# Patient Record
Sex: Female | Born: 1960 | Race: Black or African American | Hispanic: No | Marital: Married | Smoking: Former smoker
Health system: Southern US, Community
[De-identification: ages and names within clinical notes are randomized; demographics above are authoritative.]

## PROBLEM LIST (undated history)

## (undated) DIAGNOSIS — R079 Chest pain, unspecified: Secondary | ICD-10-CM

## (undated) DIAGNOSIS — R202 Paresthesia of skin: Secondary | ICD-10-CM

## (undated) DIAGNOSIS — M1990 Unspecified osteoarthritis, unspecified site: Secondary | ICD-10-CM

## (undated) DIAGNOSIS — M21619 Bunion of unspecified foot: Secondary | ICD-10-CM

## (undated) DIAGNOSIS — Z720 Tobacco use: Secondary | ICD-10-CM

## (undated) DIAGNOSIS — R569 Unspecified convulsions: Secondary | ICD-10-CM

## (undated) DIAGNOSIS — R519 Headache, unspecified: Secondary | ICD-10-CM

## (undated) DIAGNOSIS — R51 Headache: Secondary | ICD-10-CM

## (undated) DIAGNOSIS — E669 Obesity, unspecified: Secondary | ICD-10-CM

## (undated) HISTORY — DX: Paresthesia of skin: R20.2

## (undated) HISTORY — DX: Chest pain, unspecified: R07.9

## (undated) HISTORY — DX: Headache: R51

## (undated) HISTORY — DX: Headache, unspecified: R51.9

## (undated) HISTORY — PX: TUBAL LIGATION: SHX77

## (undated) HISTORY — DX: Unspecified convulsions: R56.9

## (undated) HISTORY — DX: Bunion of unspecified foot: M21.619

## (undated) HISTORY — PX: CHOLECYSTECTOMY, LAPAROSCOPIC: SHX56

## (undated) HISTORY — DX: Tobacco use: Z72.0

## (undated) HISTORY — DX: Obesity, unspecified: E66.9

## (undated) HISTORY — DX: Unspecified osteoarthritis, unspecified site: M19.90

---

## 1989-10-16 HISTORY — PX: TOTAL ABDOMINAL HYSTERECTOMY: SHX209

## 1996-10-16 HISTORY — PX: LAPAROSCOPIC CHOLECYSTECTOMY: SUR755

## 1999-01-10 ENCOUNTER — Emergency Department (HOSPITAL_COMMUNITY): Admission: EM | Admit: 1999-01-10 | Discharge: 1999-01-10 | Payer: Self-pay | Admitting: Emergency Medicine

## 1999-01-10 ENCOUNTER — Encounter: Payer: Self-pay | Admitting: Emergency Medicine

## 2006-04-04 ENCOUNTER — Emergency Department (HOSPITAL_COMMUNITY): Admission: EM | Admit: 2006-04-04 | Discharge: 2006-04-04 | Payer: Self-pay | Admitting: Emergency Medicine

## 2012-10-02 ENCOUNTER — Other Ambulatory Visit (HOSPITAL_COMMUNITY): Payer: Self-pay | Admitting: Family Medicine

## 2012-10-02 DIAGNOSIS — Z1231 Encounter for screening mammogram for malignant neoplasm of breast: Secondary | ICD-10-CM

## 2012-10-11 ENCOUNTER — Ambulatory Visit (HOSPITAL_COMMUNITY): Payer: BC Managed Care – PPO | Attending: Family Medicine

## 2013-08-05 ENCOUNTER — Encounter: Payer: Self-pay | Admitting: Nurse Practitioner

## 2013-08-05 ENCOUNTER — Encounter: Payer: Self-pay | Admitting: *Deleted

## 2013-08-05 ENCOUNTER — Ambulatory Visit (INDEPENDENT_AMBULATORY_CARE_PROVIDER_SITE_OTHER): Payer: BC Managed Care – PPO | Admitting: Nurse Practitioner

## 2013-08-05 VITALS — BP 141/69 | HR 69 | Ht 65.0 in | Wt 220.0 lb

## 2013-08-05 DIAGNOSIS — F17200 Nicotine dependence, unspecified, uncomplicated: Secondary | ICD-10-CM

## 2013-08-05 DIAGNOSIS — Z720 Tobacco use: Secondary | ICD-10-CM

## 2013-08-05 DIAGNOSIS — R079 Chest pain, unspecified: Secondary | ICD-10-CM

## 2013-08-05 NOTE — Progress Notes (Signed)
Exercise Treadmill Test  Pre-Exercise Testing Evaluation Rhythm: normal sinus  Rate: 69     Test  Exercise Tolerance Test Ordering MD: Shirlee Latch, MD  Interpreting MD: Norma Fredrickson, NP  Unique Test No: 1  Treadmill:  1  Indication for ETT: chest pain - rule out ischemia  Contraindication to ETT: No   Stress Modality: exercise - treadmill  Cardiac Imaging Performed: non   Protocol: standard Bruce - maximal  Max BP:  174/79  Max MPHR (bpm): 162 85% MPR (bpm):  145  MPHR obtained (bpm):  170 % MPHR obtained:  95%  Reached 85% MPHR (min:sec):  4 minutes Total Exercise Time (min-sec):  4:45  Workload in METS:  7.2 Borg Scale: 17  Reason ETT Terminated:  patient's desire to stop    ST Segment Analysis At Rest: normal ST segments - no evidence of significant ST depression With Exercise: no evidence of significant ST depression  Other Information Arrhythmia:  No Angina during ETT:  absent (0) Quality of ETT:  diagnostic  ETT Interpretation:  normal - no evidence of ischemia by ST analysis  Comments: Patient presents today for routine GXT. Has had atypical chest pain. Ongoing tobacco abuse.   Today the patient exercised on the standard Bruce protocol for a total of 4:45 minutes.  Reduced exercise tolerance.  Adequate blood pressure response.  Clinically negative for angina.  Test was stopped due to dyspnea - no chest pain. Target HR was achieved.  EKG negative for ischemia. No significant arrhythmia noted.   Recommendations: CV risk factor modification with diet/weight loss/exercise/smoking cessation  See back prn.   Patient is agreeable to this plan and will call if any problems develop in the interim.   Rosalio Macadamia, RN, ANP-C [facility] [address] [place], [place]  [address]

## 2014-04-08 ENCOUNTER — Other Ambulatory Visit (HOSPITAL_COMMUNITY): Payer: Self-pay | Admitting: Family Medicine

## 2014-04-08 DIAGNOSIS — Z1231 Encounter for screening mammogram for malignant neoplasm of breast: Secondary | ICD-10-CM

## 2014-04-15 ENCOUNTER — Ambulatory Visit (HOSPITAL_COMMUNITY)
Admission: RE | Admit: 2014-04-15 | Discharge: 2014-04-15 | Disposition: A | Discharge status: Home / Self Care | Payer: BC Managed Care – PPO | Source: Ambulatory Visit | Attending: Family Medicine | Admitting: Family Medicine

## 2014-04-15 DIAGNOSIS — Z1231 Encounter for screening mammogram for malignant neoplasm of breast: Secondary | ICD-10-CM | POA: Insufficient documentation

## 2014-06-18 ENCOUNTER — Ambulatory Visit
Admission: RE | Admit: 2014-06-18 | Discharge: 2014-06-18 | Disposition: A | Discharge status: Home / Self Care | Payer: BC Managed Care – PPO | Source: Ambulatory Visit | Attending: Family Medicine | Admitting: Family Medicine

## 2014-06-18 ENCOUNTER — Other Ambulatory Visit: Payer: Self-pay | Admitting: Family Medicine

## 2014-06-18 DIAGNOSIS — R7612 Nonspecific reaction to cell mediated immunity measurement of gamma interferon antigen response without active tuberculosis: Secondary | ICD-10-CM

## 2015-08-26 ENCOUNTER — Other Ambulatory Visit (HOSPITAL_COMMUNITY): Payer: Self-pay | Admitting: Family Medicine

## 2015-08-26 DIAGNOSIS — R079 Chest pain, unspecified: Secondary | ICD-10-CM

## 2015-08-27 ENCOUNTER — Other Ambulatory Visit: Payer: Self-pay

## 2015-08-27 ENCOUNTER — Ambulatory Visit (HOSPITAL_COMMUNITY): Payer: BLUE CROSS/BLUE SHIELD | Attending: Cardiology

## 2015-08-27 DIAGNOSIS — I253 Aneurysm of heart: Secondary | ICD-10-CM | POA: Insufficient documentation

## 2015-08-27 DIAGNOSIS — I340 Nonrheumatic mitral (valve) insufficiency: Secondary | ICD-10-CM | POA: Diagnosis not present

## 2015-08-27 DIAGNOSIS — F17200 Nicotine dependence, unspecified, uncomplicated: Secondary | ICD-10-CM | POA: Diagnosis not present

## 2015-08-27 DIAGNOSIS — R079 Chest pain, unspecified: Secondary | ICD-10-CM

## 2015-08-27 DIAGNOSIS — I517 Cardiomegaly: Secondary | ICD-10-CM | POA: Insufficient documentation

## 2016-05-24 ENCOUNTER — Other Ambulatory Visit: Payer: Self-pay | Admitting: Family Medicine

## 2016-05-24 DIAGNOSIS — Z1231 Encounter for screening mammogram for malignant neoplasm of breast: Secondary | ICD-10-CM

## 2016-05-30 ENCOUNTER — Ambulatory Visit
Admission: RE | Admit: 2016-05-30 | Discharge: 2016-05-30 | Disposition: A | Discharge status: Home / Self Care | Payer: 59 | Source: Ambulatory Visit | Attending: Family Medicine | Admitting: Family Medicine

## 2016-05-30 DIAGNOSIS — Z1231 Encounter for screening mammogram for malignant neoplasm of breast: Secondary | ICD-10-CM

## 2017-05-25 ENCOUNTER — Other Ambulatory Visit: Payer: Self-pay | Admitting: Family Medicine

## 2017-05-28 ENCOUNTER — Other Ambulatory Visit: Payer: Self-pay | Admitting: Obstetrics & Gynecology

## 2017-05-28 DIAGNOSIS — N644 Mastodynia: Secondary | ICD-10-CM

## 2017-06-01 ENCOUNTER — Ambulatory Visit
Admission: RE | Admit: 2017-06-01 | Discharge: 2017-06-01 | Disposition: A | Discharge status: Home / Self Care | Payer: 59 | Source: Ambulatory Visit | Attending: Obstetrics & Gynecology | Admitting: Obstetrics & Gynecology

## 2017-06-01 DIAGNOSIS — N644 Mastodynia: Secondary | ICD-10-CM

## 2017-12-14 ENCOUNTER — Other Ambulatory Visit: Payer: Self-pay | Admitting: Family Medicine

## 2017-12-14 DIAGNOSIS — R251 Tremor, unspecified: Secondary | ICD-10-CM

## 2017-12-14 DIAGNOSIS — R569 Unspecified convulsions: Secondary | ICD-10-CM

## 2017-12-17 ENCOUNTER — Ambulatory Visit
Admission: RE | Admit: 2017-12-17 | Discharge: 2017-12-17 | Disposition: A | Discharge status: Home / Self Care | Payer: 59 | Source: Ambulatory Visit | Attending: Family Medicine | Admitting: Family Medicine

## 2017-12-17 DIAGNOSIS — R251 Tremor, unspecified: Secondary | ICD-10-CM

## 2017-12-17 DIAGNOSIS — R569 Unspecified convulsions: Secondary | ICD-10-CM

## 2017-12-17 MED ORDER — GADOBENATE DIMEGLUMINE 529 MG/ML IV SOLN
19.0000 mL | Freq: Once | INTRAVENOUS | Status: AC | PRN
  Administered 2017-12-17: 19 mL via INTRAVENOUS

## 2017-12-18 ENCOUNTER — Other Ambulatory Visit: Payer: Self-pay | Admitting: Family Medicine

## 2017-12-18 DIAGNOSIS — R251 Tremor, unspecified: Secondary | ICD-10-CM

## 2018-04-15 DIAGNOSIS — R569 Unspecified convulsions: Secondary | ICD-10-CM

## 2018-04-15 HISTORY — DX: Unspecified convulsions: R56.9

## 2018-04-23 ENCOUNTER — Ambulatory Visit: Payer: 59 | Admitting: Diagnostic Neuroimaging

## 2018-04-23 ENCOUNTER — Encounter: Payer: Self-pay | Admitting: *Deleted

## 2018-04-23 ENCOUNTER — Encounter: Payer: Self-pay | Admitting: Diagnostic Neuroimaging

## 2018-04-23 VITALS — BP 125/70 | HR 62 | Ht 64.5 in | Wt 204.2 lb

## 2018-04-23 DIAGNOSIS — F1029 Alcohol dependence with unspecified alcohol-induced disorder: Secondary | ICD-10-CM | POA: Diagnosis not present

## 2018-04-23 DIAGNOSIS — G40909 Epilepsy, unspecified, not intractable, without status epilepticus: Secondary | ICD-10-CM

## 2018-04-23 MED ORDER — LEVETIRACETAM 500 MG PO TABS: 500.0000 mg | ORAL_TABLET | Freq: Two times a day (BID) | ORAL | 12 refills | Status: DC

## 2018-04-23 NOTE — Progress Notes (Signed)
[facility]  PATIENT: Jamie Reed DOB: October 27, 1960  REFERRING CLINICIAN: Elbert EwingsL Lupe Carneyean Mitchell HISTORY FROM: patient  REASON FOR VISIT: new consult   HISTORICAL  CHIEF COMPLAINT:  Chief Complaint  Patient presents with  . NP  Dr. Clovis RileyMitchell  . New onset seziures    Has had 2 sz (11/2017 and 04/22/2018) nocturnal both times w/ incontnence.  Daughter, Marygrace Droughtniesia Rm 7.      HISTORY OF PRESENT ILLNESS:   57 year old female with history of alcohol abuse, here for evaluation of seizures.  Patient drinks at least 4 alcoholic drinks per day.  She is done this for many years.  In February 2019 patient stopped drinking for 3 days and then had 2 seizures in the night during her sleep.  She was shaking, had incontinence and then vomiting.  She had headache and felt confused afterwards.  July 2019 patient had another episode of convulsions, tongue biting, incontinence and postictal confusion.  Patient had been drinking alcohol in the days leading up to this and had not cut down or quit.  No family history of seizures.  No recent injuries, infections or traumas.   REVIEW OF SYSTEMS: Full 14 system review of systems performed and negative with exception of: Swelling in legs joint swelling aching muscles allergies itching headache numbness seizure.  ALLERGIES: Allergies  Allergen Reactions  . Asa [Aspirin]     Headache, stomach issues    HOME MEDICATIONS: Outpatient Medications Prior to Visit  Medication Sig Dispense Refill  . ondansetron (ZOFRAN-ODT) 8 MG disintegrating tablet      No facility-administered medications prior to visit.     PAST MEDICAL HISTORY: Past Medical History:  Diagnosis Date  . Bunion   . Chest pain   . Headache   . Obesity   . Osteoarthritis   . Paresthesia of both hands   . Seizures (HCC)   . Tobacco abuse     PAST SURGICAL HISTORY: Past Surgical History:  Procedure Laterality Date  . CHOLECYSTECTOMY, LAPAROSCOPIC    . LAPAROSCOPIC  CHOLECYSTECTOMY  1998  . TOTAL ABDOMINAL HYSTERECTOMY  1991  . TUBAL LIGATION      FAMILY HISTORY: Family History  Problem Relation Age of Onset  . Hypertension Mother   . Diabetes Mother   . Heart failure Mother     SOCIAL HISTORY:  Social History   Socioeconomic History  . Marital status: Married    Spouse name: Not on file  . Number of children: Not on file  . Years of education: Not on file  . Highest education level: Not on file  Occupational History  . Not on file  Social Needs  . Financial resource strain: Not on file  . Food insecurity:    Worry: Not on file    Inability: Not on file  . Transportation needs:    Medical: Not on file    Non-medical: Not on file  Tobacco Use  . Smoking status: Former Smoker    Last attempt to quit: 10/16/2014    Years since quitting: 3.5  . Smokeless tobacco: Never Used  Substance and Sexual Activity  . Alcohol use: Yes    Comment: 4 daily  . Drug use: No  . Sexual activity: Not Currently  Lifestyle  . Physical activity:    Days per week: Not on file    Minutes per session: Not on file  . Stress: Not on file  Relationships  . Social connections:    Talks on phone: Not on  file    Gets together: Not on file    Attends religious service: Not on file    Active member of club or organization: Not on file    Attends meetings of clubs or organizations: Not on file    Relationship status: Not on file  . Intimate partner violence:    Fear of current or ex partner: Not on file    Emotionally abused: Not on file    Physically abused: Not on file    Forced sexual activity: Not on file  Other Topics Concern  . Not on file  Social History Narrative   Lives home, with husband, Works at [employer]  Education: Chief Operating Officer.   Caffeine Rare Decaff.       PHYSICAL EXAM  GENERAL EXAM/CONSTITUTIONAL: Vitals:  Vitals:   04/23/18 1112  BP: 125/70  Pulse: 62  Weight: 204 lb 3.2 oz (92.6 kg)  Height: 5' 4.5" (1.638 m)      Body mass index is 34.51 kg/m.  No exam data present  Patient is in no distress; well developed, nourished and groomed; neck is supple  CARDIOVASCULAR:  Examination of carotid arteries is normal; no carotid bruits  Regular rate and rhythm, no murmurs  Examination of peripheral vascular system by observation and palpation is normal  EYES:  Ophthalmoscopic exam of optic discs and posterior segments is normal; no papilledema or hemorrhages  MUSCULOSKELETAL:  Gait, strength, tone, movements noted in Neurologic exam below  NEUROLOGIC: MENTAL STATUS:  No flowsheet data found.  awake, alert, oriented to person, place and time  recent and remote memory intact  normal attention and concentration  language fluent, comprehension intact, naming intact,   fund of knowledge appropriate  CRANIAL NERVE:   2nd - no papilledema on fundoscopic exam  2nd, 3rd, 4th, 6th - pupils equal and reactive to light, visual fields full to confrontation, extraocular muscles intact, no nystagmus  5th - facial sensation symmetric  7th - facial strength symmetric  8th - hearing intact  9th - palate elevates symmetrically, uvula midline  11th - shoulder shrug symmetric  12th - tongue protrusion midline  MOTOR:   normal bulk and tone, full strength in the BUE, BLE  SENSORY:   normal and symmetric to light touch, temperature, vibration  COORDINATION:   finger-nose-finger, fine finger movements normal  REFLEXES:   deep tendon reflexes present and symmetric  GAIT/STATION:   narrow based gait; DIFF WITH TANDEM; romberg is negative    DIAGNOSTIC DATA (LABS, IMAGING, TESTING) - I reviewed patient records, labs, notes, testing and imaging myself where available.  No results found for: WBC, HGB, HCT, MCV, PLT No results found for: NA, K, CL, CO2, GLUCOSE, BUN, CREATININE, CALCIUM, PROT, ALBUMIN, AST, ALT, ALKPHOS, BILITOT, GFRNONAA, GFRAA No results found for: CHOL, HDL,  LDLCALC, LDLDIRECT, TRIG, CHOLHDL No results found for: ZOXW9U No results found for: VITAMINB12 No results found for: TSH  12/17/17 MRI brain [I reviewed images myself and agree with interpretation. -VRP]  - No acute abnormality. Mild periventricular white matter changes are nonspecific. This could be due to chronic microvascular ischemia. Migraine headache, demyelinating disease, and vasculitis other considerations.    ASSESSMENT AND PLAN  57 y.o. year old female here with 2 seizures in February 2019, possibly related to alcohol withdrawal, with new seizure in July 2019, with no specific triggering factor.  Patient has current alcohol abuse, averaging 4 or more drinks per day.  We will proceed with further work-up and start antiseizure medication.  Dx:  1. Seizure disorder (HCC)   2. Alcohol dependence with unspecified alcohol-induced disorder (HCC)      PLAN:  - start levetiracetam 500mg  twice a day   - check MRI brain  - check EEG  - follow up with PCP regarding medical supervision of reducing alcohol  - According to [place] law, you can not drive unless you are seizure / syncope free for at least 6 months and under physician's care.   - Please maintain precautions. Do not participate in activities where a loss of awareness could harm you or someone else. No swimming alone, no tub bathing, no hot tubs, no driving, no operating motorized vehicles (cars, ATVs, motocycles, etc), lawnmowers, power tools or firearms. No standing at heights, such as rooftops, ladders or stairs. Avoid hot objects such as stoves, heaters, open fires. Wear a helmet when riding a bicycle, scooter, skateboard, etc. and avoid areas of traffic. Set your water heater to 120 degrees or less.  Orders Placed This Encounter  Procedures  . MR BRAIN W WO CONTRAST  . EEG adult   Meds ordered this encounter  Medications  . DISCONTD: levETIRAcetam (KEPPRA) 500 MG tablet    Sig: Take 1 tablet (500 mg total) by  mouth 2 (two) times daily.    Dispense:  60 tablet    Refill:  12  . levETIRAcetam (KEPPRA) 500 MG tablet    Sig: Take 1 tablet (500 mg total) by mouth 2 (two) times daily.    Dispense:  60 tablet    Refill:  12   Return in about 6 months (around 10/24/2018) for with NP or Trenae Brunke.  I reviewed images, labs, notes, records myself. I summarized findings and reviewed with patient, for this high risk condition (seizure) requiring high complexity decision making.     Suanne Marker, MD 04/23/2018, 12:00 PM Certified in Neurology, Neurophysiology and Neuroimaging  [facility] [address], [address] [place], [place] [address] [phone]

## 2018-04-23 NOTE — Patient Instructions (Signed)
-   start levetiracetam 500mg  twice a day   - check MRI brain  - check EEG  - follow up with PCP regarding medical supervision of reducing alcohol  - According to [place] law, you can not drive unless you are seizure / syncope free for at least 6 months and under physician's care.   - Please maintain precautions. Do not participate in activities where a loss of awareness could harm you or someone else. No swimming alone, no tub bathing, no hot tubs, no driving, no operating motorized vehicles (cars, ATVs, motocycles, etc), lawnmowers, power tools or firearms. No standing at heights, such as rooftops, ladders or stairs. Avoid hot objects such as stoves, heaters, open fires. Wear a helmet when riding a bicycle, scooter, skateboard, etc. and avoid areas of traffic. Set your water heater to 120 degrees or less.

## 2018-04-24 ENCOUNTER — Telehealth: Payer: Self-pay | Admitting: Diagnostic Neuroimaging

## 2018-04-24 NOTE — Telephone Encounter (Signed)
Pt is asking for a call back to explain the need for another MRI, pt states she has been informed her insurance will not cover another MRI, please call

## 2018-04-24 NOTE — Telephone Encounter (Signed)
LMVM for pt on her home # (ok per DRP) that due to ongoing sz activity is reason for another MRI.  She is to call back if questions.  I LMVM for her on her cell that I called and to call me back.

## 2018-04-24 NOTE — Telephone Encounter (Signed)
Due to ongoing seizure activity. -VRP

## 2018-04-25 DIAGNOSIS — G40909 Epilepsy, unspecified, not intractable, without status epilepticus: Secondary | ICD-10-CM | POA: Insufficient documentation

## 2018-04-25 DIAGNOSIS — F1029 Alcohol dependence with unspecified alcohol-induced disorder: Secondary | ICD-10-CM | POA: Insufficient documentation

## 2018-04-29 ENCOUNTER — Telehealth: Payer: Self-pay | Admitting: Diagnostic Neuroimaging

## 2018-04-29 NOTE — Telephone Encounter (Signed)
[employer] Auth: [id number] (exp. 04/29/18 to 06/13/18) order sent to [facility]. They will reach out to the pt to schedule.

## 2018-05-01 ENCOUNTER — Ambulatory Visit: Payer: 59 | Admitting: Diagnostic Neuroimaging

## 2018-05-01 DIAGNOSIS — G40909 Epilepsy, unspecified, not intractable, without status epilepticus: Secondary | ICD-10-CM | POA: Diagnosis not present

## 2018-05-10 ENCOUNTER — Telehealth: Payer: Self-pay | Admitting: Diagnostic Neuroimaging

## 2018-05-10 NOTE — Telephone Encounter (Signed)
Normal eeg. -VRP 

## 2018-05-10 NOTE — Telephone Encounter (Signed)
Pt requesting a call to discuss her EEG results. Please call to advise

## 2018-05-10 NOTE — Telephone Encounter (Signed)
She had done 05-01-18.  Have you read?

## 2018-05-10 NOTE — Procedures (Signed)
   [facility]  EEG (ELECTROENCEPHALOGRAM) REPORT   STUDY DATE: 05/01/18 PATIENT NAME: Jamie Reed DOB: 29-Oct-1960 MRN: [id number]  ORDERING CLINICIAN: Joycelyn SchmidVikram Penumalli, MD   TECHNOLOGIST: Charlesetta IvoryBeau Handy TECHNIQUE: Electroencephalogram was recorded utilizing standard 10-20 system of lead placement and reformatted into average and bipolar montages.  RECORDING TIME: 21 minutes  ACTIVATION: hyperventilation and photic stimulation  CLINICAL INFORMATION: 57 year old female with seizure.  FINDINGS: Posterior dominant background rhythms, which attenuate with eye opening, ranging 11-12 hertz and 35-40 microvolts. No focal, lateralizing, epileptiform activity or seizures are seen. Patient recorded in the awake and drowsy state. EKG channel shows regular rhythm of 70-75 beats per minute.   IMPRESSION:   Normal EEG in the awake and drowsy states.    INTERPRETING PHYSICIAN:  Suanne MarkerVIKRAM R. PENUMALLI, MD Certified in Neurology, Neurophysiology and Neuroimaging  [facility] [address], [address] [place], [place] [address] [phone]

## 2018-05-13 NOTE — Telephone Encounter (Signed)
LMVM for pt on home # (ok per DPR) that her EEG normal.  She is to call back if questions.

## 2018-05-15 ENCOUNTER — Encounter: Payer: Self-pay | Admitting: Diagnostic Neuroimaging

## 2018-05-15 ENCOUNTER — Telehealth: Payer: Self-pay | Admitting: Diagnostic Neuroimaging

## 2018-05-15 NOTE — Telephone Encounter (Signed)
Spoke with patient and reviewed Dr Richrd HumblesPenumalli's plan at her office visit and his recommendations. Reminded her of [place] law re: no driving. Advised she monitor her symptoms, continue taking levetiracetam as prescribed. Advised she call for any problems or questions or seizure activity prior to her FU in Jan. She verbalized understanding, appreciation.

## 2018-05-15 NOTE — Telephone Encounter (Signed)
Pt said since MRI and EEG are normal what is the next step? She can be reached at [phone]

## 2018-10-28 ENCOUNTER — Ambulatory Visit: Payer: 59 | Admitting: Diagnostic Neuroimaging

## 2018-12-10 ENCOUNTER — Ambulatory Visit: Payer: 59 | Admitting: Diagnostic Neuroimaging

## 2019-02-03 ENCOUNTER — Telehealth: Payer: Self-pay | Admitting: *Deleted

## 2019-02-03 NOTE — Telephone Encounter (Signed)
LVM advising due to current COVID 19 pandemic, our office is severely reducing in person visits in order to minimize the risk to our patients and healthcare providers. We recommend to convert your appointment to a video visit. Advised her the appt can be moved sooner; she was to FU in Jan 2020. Left number for call back.

## 2019-02-10 NOTE — Telephone Encounter (Signed)
Reached patient on her cell phone and advised her due to current COVID 19 pandemic, our office is severely reducing in person visits in order to minimize the risk to our patients and healthcare providers. We recommend to convert your appointment to a video visit.  She asked if there is a co pay, and I advised her this will be treated like an office visit, it is filed with her insurance but I don't have the answer to her question. She stated she wasn't home to check her calendar, but she will call back.

## 2019-02-17 NOTE — Telephone Encounter (Signed)
Called patient to convert her FU on Wed to video.she stated it needed to be canceled because she is in the middle of changing insurances. She stated she will call back and reschedule when she has new insurance. She  verbalized understanding, appreciation. Her FU was cancelled.

## 2019-02-19 ENCOUNTER — Ambulatory Visit: Payer: 59 | Admitting: Diagnostic Neuroimaging

## 2019-03-20 DIAGNOSIS — Z01411 Encounter for gynecological examination (general) (routine) with abnormal findings: Secondary | ICD-10-CM | POA: Diagnosis not present

## 2019-03-20 DIAGNOSIS — R21 Rash and other nonspecific skin eruption: Secondary | ICD-10-CM | POA: Diagnosis not present

## 2019-04-17 ENCOUNTER — Other Ambulatory Visit: Payer: Self-pay | Admitting: Obstetrics & Gynecology

## 2019-04-17 DIAGNOSIS — Z1231 Encounter for screening mammogram for malignant neoplasm of breast: Secondary | ICD-10-CM

## 2019-04-29 DIAGNOSIS — H524 Presbyopia: Secondary | ICD-10-CM | POA: Diagnosis not present

## 2019-05-13 ENCOUNTER — Encounter: Payer: Self-pay | Admitting: Diagnostic Neuroimaging

## 2019-05-13 ENCOUNTER — Ambulatory Visit (INDEPENDENT_AMBULATORY_CARE_PROVIDER_SITE_OTHER): Payer: BC Managed Care – PPO | Admitting: Diagnostic Neuroimaging

## 2019-05-13 ENCOUNTER — Other Ambulatory Visit: Payer: Self-pay

## 2019-05-13 VITALS — BP 141/80 | HR 79 | Temp 97.3°F | Ht 65.0 in | Wt 217.6 lb

## 2019-05-13 DIAGNOSIS — G40909 Epilepsy, unspecified, not intractable, without status epilepticus: Secondary | ICD-10-CM

## 2019-05-13 DIAGNOSIS — F1029 Alcohol dependence with unspecified alcohol-induced disorder: Secondary | ICD-10-CM

## 2019-05-13 MED ORDER — LEVETIRACETAM 500 MG PO TABS: 500.0000 mg | ORAL_TABLET | Freq: Two times a day (BID) | ORAL | 4 refills | Status: DC

## 2019-05-13 NOTE — Progress Notes (Signed)
[facility]  PATIENT: Jamie Reed DOB: 1961/04/02  REFERRING CLINICIAN: Carlean Jews Donnie Coffin HISTORY FROM: patient  REASON FOR VISIT: follow up   HISTORICAL  CHIEF COMPLAINT:  Chief Complaint  Patient presents with  . Seizures    rm 7 FU, "doing well"    HISTORY OF PRESENT ILLNESS:   UPDATE (05/13/19, VRP): Since last visit, doing well. Symptoms are stable. No further seizures. No alleviating or aggravating factors. Tolerating LEV500mg  twice a day. Etoh use continues.  PRIOR HPI (04/23/18): 58 year old female with history of alcohol abuse, here for evaluation of seizures.  Patient drinks at least 4 alcoholic drinks per day.  She is done this for many years.  In February 2019 patient stopped drinking for 3 days and then had 2 seizures in the night during her sleep.  She was shaking, had incontinence and then vomiting.  She had headache and felt confused afterwards.  July 2019 patient had another episode of convulsions, tongue biting, incontinence and postictal confusion.  Patient had been drinking alcohol in the days leading up to this and had not cut down or quit.  No family history of seizures.  No recent injuries, infections or traumas.   REVIEW OF SYSTEMS: Full 14 system review of systems performed and negative with exception of: as per HPI.   ALLERGIES: Allergies  Allergen Reactions  . Asa [Aspirin]     Headache, stomach issues    HOME MEDICATIONS: Outpatient Medications Prior to Visit  Medication Sig Dispense Refill  . levETIRAcetam (KEPPRA) 500 MG tablet Take 1 tablet (500 mg total) by mouth 2 (two) times daily. 60 tablet 12   No facility-administered medications prior to visit.     PAST MEDICAL HISTORY: Past Medical History:  Diagnosis Date  . Bunion   . Chest pain   . Headache   . Obesity   . Osteoarthritis   . Paresthesia of both hands   . Seizures ([place]) 04/2018  . Tobacco abuse     PAST SURGICAL HISTORY: Past Surgical History:   Procedure Laterality Date  . CHOLECYSTECTOMY, LAPAROSCOPIC    . LAPAROSCOPIC CHOLECYSTECTOMY  1998  . TOTAL ABDOMINAL HYSTERECTOMY  1991  . TUBAL LIGATION      FAMILY HISTORY: Family History  Problem Relation Age of Onset  . Hypertension Mother   . Diabetes Mother   . Heart failure Mother     SOCIAL HISTORY:  Social History   Socioeconomic History  . Marital status: Married    Spouse name: Not on file  . Number of children: Not on file  . Years of education: Not on file  . Highest education level: Not on file  Occupational History  . Not on file  Social Needs  . Financial resource strain: Not on file  . Food insecurity    Worry: Not on file    Inability: Not on file  . Transportation needs    Medical: Not on file    Non-medical: Not on file  Tobacco Use  . Smoking status: Former Smoker    Quit date: 10/16/2014    Years since quitting: 4.5  . Smokeless tobacco: Never Used  Substance and Sexual Activity  . Alcohol use: Yes    Comment: 4 daily  . Drug use: No  . Sexual activity: Not Currently  Lifestyle  . Physical activity    Days per week: Not on file    Minutes per session: Not on file  . Stress: Not on file  Relationships  .  Social Musicianconnections    Talks on phone: Not on file    Gets together: Not on file    Attends religious service: Not on file    Active member of club or organization: Not on file    Attends meetings of clubs or organizations: Not on file    Relationship status: Not on file  . Intimate partner violence    Fear of current or ex partner: Not on file    Emotionally abused: Not on file    Physically abused: Not on file    Forced sexual activity: Not on file  Other Topics Concern  . Not on file  Social History Narrative   Lives home, with husband, Works at [employer].  Education: Chief Operating OfficerBachelors.   Caffeine Rare Decaff.       PHYSICAL EXAM  GENERAL EXAM/CONSTITUTIONAL: Vitals:  Vitals:   05/13/19 0840  BP: (!) 141/80  Pulse: 79   Temp: (!) 97.3 F (36.3 C)  Weight: 217 lb 9.6 oz (98.7 kg)  Height: 5\' 5"  (1.651 m)     Body mass index is 36.21 kg/m. Wt Readings from Last 3 Encounters:  05/13/19 217 lb 9.6 oz (98.7 kg)  04/23/18 204 lb 3.2 oz (92.6 kg)  04/23/18 235 lb (106.6 kg)     Patient is in no distress; well developed, nourished and groomed; neck is supple  CARDIOVASCULAR:  Examination of carotid arteries is normal; no carotid bruits  Regular rate and rhythm, no murmurs  Examination of peripheral vascular system by observation and palpation is normal  EYES:  Ophthalmoscopic exam of optic discs and posterior segments is normal; no papilledema or hemorrhages  No exam data present  MUSCULOSKELETAL:  Gait, strength, tone, movements noted in Neurologic exam below  NEUROLOGIC: MENTAL STATUS:  No flowsheet data found.  awake, alert, oriented to person, place and time  recent and remote memory intact  normal attention and concentration  language fluent, comprehension intact, naming intact  fund of knowledge appropriate  CRANIAL NERVE:   2nd - no papilledema on fundoscopic exam  2nd, 3rd, 4th, 6th - pupils equal and reactive to light, visual fields full to confrontation, extraocular muscles intact, no nystagmus  5th - facial sensation symmetric  7th - facial strength symmetric  8th - hearing intact  9th - palate elevates symmetrically, uvula midline  11th - shoulder shrug symmetric  12th - tongue protrusion midline  MOTOR:   normal bulk and tone, full strength in the BUE, BLE  SENSORY:   normal and symmetric to light touch  COORDINATION:   finger-nose-finger, fine finger movements normal  REFLEXES:   deep tendon reflexes 1+ and symmetric  GAIT/STATION:   narrow based gait     DIAGNOSTIC DATA (LABS, IMAGING, TESTING) - I reviewed patient records, labs, notes, testing and imaging myself where available.  No results found for: WBC, HGB, HCT, MCV, PLT No  results found for: NA, K, CL, CO2, GLUCOSE, BUN, CREATININE, CALCIUM, PROT, ALBUMIN, AST, ALT, ALKPHOS, BILITOT, GFRNONAA, GFRAA No results found for: CHOL, HDL, LDLCALC, LDLDIRECT, TRIG, CHOLHDL No results found for: [id number] No results found for: VITAMINB12 No results found for: TSH  12/17/17 MRI brain [I reviewed images myself and agree with interpretation. -VRP]  - No acute abnormality. Mild periventricular white matter changes are nonspecific. This could be due to chronic microvascular ischemia. Migraine headache, demyelinating disease, and vasculitis other Considerations.  05/01/18 EEG - normal    ASSESSMENT AND PLAN  58 y.o. year old female here  with 2 seizures in February 2019, possibly related to alcohol withdrawal, with new seizure in July 2019, with no specific triggering factor.  Patient has current alcohol abuse, averaging 4 or more drinks per day.    Dx:  1. Seizure disorder (HCC)   2. Alcohol dependence with unspecified alcohol-induced disorder (HCC)      PLAN:  - continue levetiracetam 500mg  twice a day   - follow up with PCP regarding medical supervision of reducing alcohol  - According to [place] law, you can not drive unless you are seizure / syncope free for at least 6 months and under physician's care.   - Please maintain precautions. Do not participate in activities where a loss of awareness could harm you or someone else. No swimming alone, no tub bathing, no hot tubs, no driving, no operating motorized vehicles (cars, ATVs, motocycles, etc), lawnmowers, power tools or firearms. No standing at heights, such as rooftops, ladders or stairs. Avoid hot objects such as stoves, heaters, open fires. Wear a helmet when riding a bicycle, scooter, skateboard, etc. and avoid areas of traffic. Set your water heater to 120 degrees or less.  Meds ordered this encounter  Medications  . levETIRAcetam (KEPPRA) 500 MG tablet    Sig: Take 1 tablet (500 mg total) by mouth 2 (two)  times daily.    Dispense:  180 tablet    Refill:  4   Return in about 1 year (around 05/12/2020) for with NP (Amy Lomax).  I reviewed images, labs, notes, records myself. I summarized findings and reviewed with patient, for this high risk condition (seizure) requiring high complexity decision making.     Suanne MarkerVIKRAM R. , MD 05/13/2019, 8:53 AM Certified in Neurology, Neurophysiology and Neuroimaging  [facility] [address], [address] [place], [place] [address] [phone]

## 2019-05-27 ENCOUNTER — Ambulatory Visit
Admission: RE | Admit: 2019-05-27 | Discharge: 2019-05-27 | Disposition: A | Discharge status: Home / Self Care | Payer: BC Managed Care – PPO | Source: Ambulatory Visit | Attending: Obstetrics & Gynecology | Admitting: Obstetrics & Gynecology

## 2019-05-27 ENCOUNTER — Other Ambulatory Visit: Payer: Self-pay

## 2019-05-27 DIAGNOSIS — Z1231 Encounter for screening mammogram for malignant neoplasm of breast: Secondary | ICD-10-CM | POA: Diagnosis not present

## 2019-08-28 DIAGNOSIS — Z0000 Encounter for general adult medical examination without abnormal findings: Secondary | ICD-10-CM | POA: Diagnosis not present

## 2019-08-28 DIAGNOSIS — E781 Pure hyperglyceridemia: Secondary | ICD-10-CM | POA: Diagnosis not present

## 2020-03-23 DIAGNOSIS — Z01411 Encounter for gynecological examination (general) (routine) with abnormal findings: Secondary | ICD-10-CM | POA: Diagnosis not present

## 2020-05-17 ENCOUNTER — Other Ambulatory Visit: Payer: Self-pay | Admitting: Family Medicine

## 2020-05-17 DIAGNOSIS — Z1231 Encounter for screening mammogram for malignant neoplasm of breast: Secondary | ICD-10-CM

## 2020-05-28 ENCOUNTER — Other Ambulatory Visit: Payer: Self-pay

## 2020-05-28 ENCOUNTER — Ambulatory Visit
Admission: RE | Admit: 2020-05-28 | Discharge: 2020-05-28 | Disposition: A | Discharge status: Home / Self Care | Payer: BC Managed Care – PPO | Source: Ambulatory Visit | Attending: Family Medicine | Admitting: Family Medicine

## 2020-05-28 DIAGNOSIS — Z1231 Encounter for screening mammogram for malignant neoplasm of breast: Secondary | ICD-10-CM | POA: Diagnosis not present

## 2020-07-15 DIAGNOSIS — Z1159 Encounter for screening for other viral diseases: Secondary | ICD-10-CM | POA: Diagnosis not present

## 2020-07-20 DIAGNOSIS — Z86010 Personal history of colonic polyps: Secondary | ICD-10-CM | POA: Diagnosis not present

## 2020-07-20 DIAGNOSIS — D125 Benign neoplasm of sigmoid colon: Secondary | ICD-10-CM | POA: Diagnosis not present

## 2020-07-20 DIAGNOSIS — D123 Benign neoplasm of transverse colon: Secondary | ICD-10-CM | POA: Diagnosis not present

## 2020-08-25 ENCOUNTER — Telehealth: Payer: Self-pay | Admitting: Diagnostic Neuroimaging

## 2020-08-25 MED ORDER — LEVETIRACETAM 500 MG PO TABS: 500.0000 mg | ORAL_TABLET | Freq: Two times a day (BID) | ORAL | 1 refills | Status: DC

## 2020-08-25 NOTE — Addendum Note (Signed)
Addended by: [place] Pink on: 08/25/2020 10:27 AM   Modules accepted: Orders

## 2020-08-25 NOTE — Telephone Encounter (Signed)
Levetiracetam has been refilled x 6 months to her pharmacy.

## 2020-08-25 NOTE — Telephone Encounter (Signed)
Pt has scheduled her annual f/u.  Pt is requesting a refill for levETIRAcetam (KEPPRA) 500 MG tablet .  Pharmacy: [facility] [phone]

## 2020-09-10 DIAGNOSIS — Z20822 Contact with and (suspected) exposure to covid-19: Secondary | ICD-10-CM | POA: Diagnosis not present

## 2020-09-16 DIAGNOSIS — Z20822 Contact with and (suspected) exposure to covid-19: Secondary | ICD-10-CM | POA: Diagnosis not present

## 2020-09-20 DIAGNOSIS — Z0000 Encounter for general adult medical examination without abnormal findings: Secondary | ICD-10-CM | POA: Diagnosis not present

## 2020-09-20 DIAGNOSIS — E781 Pure hyperglyceridemia: Secondary | ICD-10-CM | POA: Diagnosis not present

## 2020-11-10 ENCOUNTER — Encounter: Payer: Self-pay | Admitting: Family Medicine

## 2020-11-10 ENCOUNTER — Ambulatory Visit: Payer: 59 | Admitting: Family Medicine

## 2020-11-10 VITALS — BP 158/78 | HR 89 | Ht 65.0 in | Wt 210.0 lb

## 2020-11-10 DIAGNOSIS — F1029 Alcohol dependence with unspecified alcohol-induced disorder: Secondary | ICD-10-CM

## 2020-11-10 DIAGNOSIS — G40909 Epilepsy, unspecified, not intractable, without status epilepticus: Secondary | ICD-10-CM | POA: Diagnosis not present

## 2020-11-10 DIAGNOSIS — R030 Elevated blood-pressure reading, without diagnosis of hypertension: Secondary | ICD-10-CM | POA: Diagnosis not present

## 2020-11-10 MED ORDER — LEVETIRACETAM 500 MG PO TABS: 500.0000 mg | ORAL_TABLET | Freq: Two times a day (BID) | ORAL | 3 refills | Status: DC

## 2020-11-10 NOTE — Patient Instructions (Addendum)
Below is our plan:  We will continue levetiracetam 500mg  twice daily. Keep a close eye on your blood pressure. Manage anxiety. Monitor use of alcohol.   Please make sure you are staying well hydrated. I recommend 50-60 ounces daily. Well balanced diet and regular exercise encouraged.    Please continue follow up with care team as directed.   Follow up with me in 1 year   You may receive a survey regarding today's visit. I encourage you to leave honest feed back as I do use this information to improve patient care. Thank you for seeing me today!     Hypertension, Adult Hypertension is another name for high blood pressure. High blood pressure forces your heart to work harder to pump blood. This can cause problems over time. There are two numbers in a blood pressure reading. There is a top number (systolic) over a bottom number (diastolic). It is best to have a blood pressure that is below 120/80. Healthy choices can help lower your blood pressure, or you may need medicine to help lower it. What are the causes? The cause of this condition is not known. Some conditions may be related to high blood pressure. What increases the risk?  Smoking.  Having type 2 diabetes mellitus, high cholesterol, or both.  Not getting enough exercise or physical activity.  Being overweight.  Having too much fat, sugar, calories, or salt (sodium) in your diet.  Drinking too much alcohol.  Having long-term (chronic) kidney disease.  Having a family history of high blood pressure.  Age. Risk increases with age.  Race. You may be at higher risk if you are African American.  Gender. Men are at higher risk than women before age 34. After age 54, women are at higher risk than men.  Having obstructive sleep apnea.  Stress. What are the signs or symptoms?  High blood pressure may not cause symptoms. Very high blood pressure (hypertensive crisis) may cause: ? Headache. ? Feelings of worry or  nervousness (anxiety). ? Shortness of breath. ? Nosebleed. ? A feeling of being sick to your stomach (nausea). ? Throwing up (vomiting). ? Changes in how you see. ? Very bad chest pain. ? Seizures. How is this treated?  This condition is treated by making healthy lifestyle changes, such as: ? Eating healthy foods. ? Exercising more. ? Drinking less alcohol.  Your health care provider may prescribe medicine if lifestyle changes are not enough to get your blood pressure under control, and if: ? Your top number is above 130. ? Your bottom number is above 80.  Your personal target blood pressure may vary. Follow these instructions at home: Eating and drinking  If told, follow the DASH eating plan. To follow this plan: ? Fill one half of your plate at each meal with fruits and vegetables. ? Fill one fourth of your plate at each meal with whole grains. Whole grains include whole-wheat pasta, brown rice, and whole-grain bread. ? Eat or drink low-fat dairy products, such as skim milk or low-fat yogurt. ? Fill one fourth of your plate at each meal with low-fat (lean) proteins. Low-fat proteins include fish, chicken without skin, eggs, beans, and tofu. ? Avoid fatty meat, cured and processed meat, or chicken with skin. ? Avoid pre-made or processed food.  Eat less than 1,500 mg of salt each day.  Do not drink alcohol if: ? Your doctor tells you not to drink. ? You are pregnant, may be pregnant, or are planning to become  pregnant.  If you drink alcohol: ? Limit how much you use to:  0-1 drink a day for women.  0-2 drinks a day for men. ? Be aware of how much alcohol is in your drink. In the [place] one drink equals one 12 oz bottle of beer (355 mL), one 5 oz glass of wine (148 mL), or one 1 oz glass of hard liquor (44 mL).   Lifestyle  Work with your doctor to stay at a healthy weight or to lose weight. Ask your doctor what the best weight is for you.  Get at least 30 minutes of  exercise most days of the week. This may include walking, swimming, or biking.  Get at least 30 minutes of exercise that strengthens your muscles (resistance exercise) at least 3 days a week. This may include lifting weights or doing Pilates.  Do not use any products that contain nicotine or tobacco, such as cigarettes, e-cigarettes, and chewing tobacco. If you need help quitting, ask your doctor.  Check your blood pressure at home as told by your doctor.  Keep all follow-up visits as told by your doctor. This is important.   Medicines  Take over-the-counter and prescription medicines only as told by your doctor. Follow directions carefully.  Do not skip doses of blood pressure medicine. The medicine does not work as well if you skip doses. Skipping doses also puts you at risk for problems.  Ask your doctor about side effects or reactions to medicines that you should watch for. Contact a doctor if you:  Think you are having a reaction to the medicine you are taking.  Have headaches that keep coming back (recurring).  Feel dizzy.  Have swelling in your ankles.  Have trouble with your vision. Get help right away if you:  Get a very bad headache.  Start to feel mixed up (confused).  Feel weak or numb.  Feel faint.  Have very bad pain in your: ? Chest. ? Belly (abdomen).  Throw up more than once.  Have trouble breathing. Summary  Hypertension is another name for high blood pressure.  High blood pressure forces your heart to work harder to pump blood.  For most people, a normal blood pressure is less than 120/80.  Making healthy choices can help lower blood pressure. If your blood pressure does not get lower with healthy choices, you may need to take medicine. This information is not intended to replace advice given to you by your health care provider. Make sure you discuss any questions you have with your health care provider. Document Revised: 06/12/2018 Document  Reviewed: 06/12/2018 Elsevier Patient Education  2021 Elsevier Inc.   Seizure, Adult A seizure is a sudden burst of abnormal electrical and chemical activity in the brain. Seizures usually last from 30 seconds to 2 minutes.  What are the causes? Common causes of this condition include:  Fever or infection.  Problems that affect the brain. These may include: ? A brain or head injury. ? Bleeding in the brain. ? A brain tumor.  Low levels of blood sugar or salt.  Kidney problems or liver problems.  Conditions that are passed from parent to child (are inherited).  Problems with a substance, such as: ? Having a reaction to a drug or a medicine. ? Stopping the use of a substance all of a sudden (withdrawal).  A stroke.  Disorders that affect how you develop. Sometimes, the cause may not be known.  What increases the risk?  Having  someone in your family who has epilepsy. In this condition, seizures happen again and again over time. They have no clear cause.  Having had a tonic-clonic seizure before. This type of seizure causes you to: ? Tighten the muscles of the whole body. ? Lose consciousness.  Having had a head injury or strokes before.  Having had a lack of oxygen at birth. What are the signs or symptoms? There are many types of seizures. The symptoms vary depending on the type of seizure you have. Symptoms during a seizure  Shaking that you cannot control (convulsions) with fast, jerky movements of muscles.  Stiffness of the body.  Breathing problems.  Feeling mixed up (confused).  Staring or not responding to sound or touch.  Head nodding.  Eyes that blink, flutter, or move fast.  Drooling, grunting, or making clicking sounds with your mouth  Losing control of when you pee or poop. Symptoms before a seizure  Feeling afraid, nervous, or worried.  Feeling like you may vomit.  Feeling like: ? You are moving when you are not. ? Things around you are  moving when they are not.  Feeling like you saw or heard something before (dj vu).  Odd tastes or smells.  Changes in how you see. You may see flashing lights or spots. Symptoms after a seizure  Feeling confused.  Feeling sleepy.  Headache.  Sore muscles. How is this treated? If your seizure stops on its own, you will not need treatment. If your seizure lasts longer than 5 minutes, you will normally need treatment. Treatment may include:  Medicines given through an IV tube.  Avoiding things, such as medicines, that are known to cause your seizures.  Medicines to prevent seizures.  A device to prevent or control seizures.  Surgery.  A diet low in carbohydrates and high in fat (ketogenic diet). Follow these instructions at home: Medicines  Take over-the-counter and prescription medicines only as told by your doctor.  Avoid foods or drinks that may keep your medicine from working, such as alcohol. Activity  Follow instructions about driving, swimming, or doing things that would be dangerous if you had another seizure. Wait until your doctor says it is safe for you to do these things.  If you live in the [place] ask your local department of motor vehicles when you can drive.  Get a lot of rest. Teaching others  Teach friends and family what to do when you have a seizure. They should: ? Help you get down to the ground. ? Protect your head and body. ? Loosen any clothing around your neck. ? Turn you on your side. ? Know whether or not you need emergency care. ? Stay with you until you are better.  Also, tell them what not to do if you have a seizure. Tell them: ? They should not hold you down. ? They should not put anything in your mouth.   General instructions  Avoid anything that gives you seizures.  Keep a seizure diary. Write down: ? What you remember about each seizure. ? What you think caused each seizure.  Keep all follow-up visits. Contact a doctor  if:  You have another seizure or seizures. Call the doctor each time you have a seizure.  The pattern of your seizures changes.  You keep having seizures with treatment.  You have symptoms of being sick or having an infection.  You are not able to take your medicine. Get help right away if:  You have  any of these problems: ? A seizure that lasts longer than 5 minutes. ? Many seizures in a row and you do not feel better between seizures. ? A seizure that makes it harder to breathe. ? A seizure and you can no longer speak or use part of your body.  You do not wake up right after a seizure.  You get hurt during a seizure.  You feel confused or have pain right after a seizure. These symptoms may be an emergency. Get help right away. Call your local emergency services (911 in the [place]  Do not wait to see if the symptoms will go away.  Do not drive yourself to the hospital. Summary  A seizure is a sudden burst of abnormal electrical and chemical activity in the brain. Seizures normally last from 30 seconds to 2 minutes.  Causes of seizures include illness, injury to the head, low levels of blood sugar or salt, and certain conditions.  Most seizures will stop on their own in less than 5 minutes. Seizures that last longer than 5 minutes are a medical emergency and need treatment right away.  Many medicines are used to treat seizures. Take over-the-counter and prescription medicines only as told by your doctor. This information is not intended to replace advice given to you by your health care provider. Make sure you discuss any questions you have with your health care provider. Document Revised: 04/09/2020 Document Reviewed: 04/09/2020 Elsevier Patient Education  2021 [employer].

## 2020-11-10 NOTE — Progress Notes (Signed)
Chief Complaint  Patient presents with  . Follow-up    Rm 2. alone  . Seizures    Taking keppra 500mg  po bid, no seizures.     HISTORY OF PRESENT ILLNESS:  11/10/20 ALL: Jamie Reed is a 60 y.o. female here today for follow up for seizures. She continues levetiracetam 500mg  BID. Last seizure 2019. She has had some concerns of elevated BP. She is scheduled to see PCP today to recheck. She feels elevated readings are related to anxiety from having to drive to work. She is looking for another job where she does not have to drive as far. She continues to drink alcohol regularly and reports trying to cut back.    HISTORY (copied from Dr previous note)  UPDATE (05/13/19, VRP): Since last visit, doing well. Symptoms are stable. No further seizures. No alleviating or aggravating factors. Tolerating LEV500mg  twice a day. Etoh use continues.  PRIOR HPI (04/23/18): 60 year old female with history of alcohol abuse, here for evaluation of seizures.  Patient drinks at least 4 alcoholic drinks per day.  She is done this for many years.  In February 2019 patient stopped drinking for 3 days and then had 2 seizures in the night during her sleep.  She was shaking, had incontinence and then vomiting.  She had headache and felt confused afterwards.  July 2019 patient had another episode of convulsions, tongue biting, incontinence and postictal confusion.  Patient had been drinking alcohol in the days leading up to this and had not cut down or quit.  No family history of seizures.  No recent injuries, infections or traumas.   REVIEW OF SYSTEMS: Out of a complete 14 system review of symptoms, the patient complains only of the following symptoms, none and all other reviewed systems are negative.   ALLERGIES: Allergies  Allergen Reactions  . Asa [Aspirin]     Headache, stomach issues     HOME MEDICATIONS: Outpatient Medications Prior to Visit  Medication Sig Dispense Refill  .  levETIRAcetam (KEPPRA) 500 MG tablet Take 1 tablet (500 mg total) by mouth 2 (two) times daily. 180 tablet 1   No facility-administered medications prior to visit.     PAST MEDICAL HISTORY: Past Medical History:  Diagnosis Date  . Bunion   . Chest pain   . Headache   . Obesity   . Osteoarthritis   . Paresthesia of both hands   . Seizures (HCC) 04/2018  . Tobacco abuse      PAST SURGICAL HISTORY: Past Surgical History:  Procedure Laterality Date  . CHOLECYSTECTOMY, LAPAROSCOPIC    . LAPAROSCOPIC CHOLECYSTECTOMY  1998  . TOTAL ABDOMINAL HYSTERECTOMY  1991  . TUBAL LIGATION       FAMILY HISTORY: Family History  Problem Relation Age of Onset  . Hypertension Mother   . Diabetes Mother   . Heart failure Mother      SOCIAL HISTORY: Social History   Socioeconomic History  . Marital status: Married    Spouse name: Not on file  . Number of children: Not on file  . Years of education: Not on file  . Highest education level: Not on file  Occupational History  . Not on file  Tobacco Use  . Smoking status: Former Smoker    Quit date: 10/16/2014    Years since quitting: 6.0  . Smokeless tobacco: Never Used  Substance and Sexual Activity  . Alcohol use: Yes    Comment: 4 daily  . Drug use: No  .  Sexual activity: Not Currently  Other Topics Concern  . Not on file  Social History Narrative   Lives home, with husband, Works at [employer].  Education: Chief Operating Officer.   Caffeine Rare Decaff.     Social Determinants of Health   Financial Resource Strain: Not on file  Food Insecurity: Not on file  Transportation Needs: Not on file  Physical Activity: Not on file  Stress: Not on file  Social Connections: Not on file  Intimate Partner Violence: Not on file      PHYSICAL EXAM  Vitals:   11/10/20 0817  BP: (!) 158/78  Pulse: 89  Weight: 210 lb (95.3 kg)  Height: 5\' 5"  (1.651 m)   Body mass index is 34.95 kg/m.   Generalized: Well developed, in no acute  distress  Cardiology: normal rate and rhythm, no murmur auscultated  Respiratory: clear to auscultation bilaterally    Neurological examination  Mentation: Alert oriented to time, place, history taking. Follows all commands speech and language fluent Cranial nerve II-XII: Pupils were equal round reactive to light. Extraocular movements were full, visual field were full on confrontational test. Facial sensation and strength were normal. Head turning and shoulder shrug  were normal and symmetric. Motor: The motor testing reveals 5 over 5 strength of all 4 extremities. Good symmetric motor tone is noted throughout.  Sensory: Sensory testing is intact to soft touch on all 4 extremities. No evidence of extinction is noted.  Coordination: Cerebellar testing reveals good finger-nose-finger and heel-to-shin bilaterally.  Gait and station: Gait is normal.  Reflexes: Deep tendon reflexes are symmetric and normal bilaterally.     DIAGNOSTIC DATA (LABS, IMAGING, TESTING) - I reviewed patient records, labs, notes, testing and imaging myself where available.  No results found for: WBC, HGB, HCT, MCV, PLT No results found for: NA, K, CL, CO2, GLUCOSE, BUN, CREATININE, CALCIUM, PROT, ALBUMIN, AST, ALT, ALKPHOS, BILITOT, GFRNONAA, GFRAA No results found for: CHOL, HDL, LDLCALC, LDLDIRECT, TRIG, CHOLHDL No results found for: No results found for: VITAMINB12 No results found for: TSH  No flowsheet data found.   ASSESSMENT AND PLAN  60 y.o. year old female  has a past medical history of Bunion, Chest pain, Headache, Obesity, Osteoarthritis, Paresthesia of both hands, Seizures (HCC) (04/2018), and Tobacco abuse. here with   Seizure disorder (HCC)  Alcohol dependence with unspecified alcohol-induced disorder (HCC)  Elevated blood pressure reading in office without diagnosis of hypertension  Jamie Reed is doing very well from a seizure standpoint.  We will continue levetiracetam 500 mg twice  daily.  We have discussed blood pressure reading in the office.  She has an appointment with primary care today to review.  We have reviewed possible contributors including anxiety, alcohol intake, and use of canned vegetables.  She will continue close follow-up with primary care to manage as appropriate.  She was encouraged to discuss any concerns of anxiety with PCP.  Healthy lifestyle habits encouraged.  Seizure precautions reviewed.  She was advised to avoid abrupt discontinuation of alcohol use but encouraged to drink in moderation.  She should not drive when drinking alcohol.  She will follow-up with me in 1 year, sooner if needed.  She verbalizes understanding and agreement with this plan.  No orders of the defined types were placed in this encounter.  Meds ordered this encounter  Medications  . levETIRAcetam (KEPPRA) 500 MG tablet    Sig: Take 1 tablet (500 mg total) by mouth 2 (two) times daily.  Dispense:  180 tablet    Refill:  3    Order Specific Question:   Supervising Provider    Answer:   Anson Fret [id number]     I spent 20 minutes of face-to-face and non-face-to-face time with patient.  This included previsit chart review, lab review, study review, order entry, electronic health record documentation, patient education.    Shawnie Dapper, MSN, FNP-C 11/10/2020, 8:46 AM  [facility] [address] [place], [place] [address] [phone]

## 2020-11-22 NOTE — Progress Notes (Signed)
I reviewed note and agree with plan.   Jeanluc Wegman R. Shakeita Vandevander, MD 11/22/2020, 3:46 PM Certified in Neurology, Neurophysiology and Neuroimaging  [facility] [address], [address] [place], [place] [address] [phone]  

## 2021-07-06 ENCOUNTER — Encounter: Payer: Self-pay | Admitting: Family Medicine

## 2021-08-29 ENCOUNTER — Other Ambulatory Visit: Payer: Self-pay | Admitting: Family Medicine

## 2021-08-29 DIAGNOSIS — Z1231 Encounter for screening mammogram for malignant neoplasm of breast: Secondary | ICD-10-CM

## 2021-10-27 ENCOUNTER — Ambulatory Visit: Payer: BC Managed Care – PPO

## 2021-11-01 ENCOUNTER — Other Ambulatory Visit: Payer: Self-pay

## 2021-11-01 ENCOUNTER — Ambulatory Visit
Admission: RE | Admit: 2021-11-01 | Discharge: 2021-11-01 | Disposition: A | Discharge status: Home / Self Care | Payer: 59 | Source: Ambulatory Visit | Attending: Family Medicine | Admitting: Family Medicine

## 2021-11-01 DIAGNOSIS — Z1231 Encounter for screening mammogram for malignant neoplasm of breast: Secondary | ICD-10-CM

## 2021-11-02 ENCOUNTER — Telehealth: Payer: Self-pay | Admitting: Family Medicine

## 2021-11-02 MED ORDER — LEVETIRACETAM 500 MG PO TABS: 500.0000 mg | ORAL_TABLET | Freq: Two times a day (BID) | ORAL | 0 refills | Status: DC

## 2021-11-02 NOTE — Telephone Encounter (Signed)
Refill sent.

## 2021-11-02 NOTE — Telephone Encounter (Signed)
Refill sent to requested pharmacy.

## 2021-11-02 NOTE — Telephone Encounter (Signed)
Pt is requesting a refill for levETIRAcetam (KEPPRA) 500 MG tablet.  Pharmacy: [facility] [phone] (pt confirmed no changes to insurance with New Year)

## 2021-11-09 NOTE — Progress Notes (Deleted)
No chief complaint on file.    HISTORY OF PRESENT ILLNESS:  11/09/21 ALL: Jamie Reed returns for follow up for seizures. She continues levetiracetam 500mg  BID.   11/10/2020 ALL: Jamie Reed is a 61 y.o. female here today for follow up for seizures. She continues levetiracetam 500mg  BID. Last seizure 2019. She has had some concerns of elevated BP. She is scheduled to see PCP today to recheck. She feels elevated readings are related to anxiety from having to drive to work. She is looking for another job where she does not have to drive as far. She continues to drink alcohol regularly and reports trying to cut back.    HISTORY (copied from Dr previous note)  UPDATE (05/13/19, VRP): Since last visit, doing well. Symptoms are stable. No further seizures. No alleviating or aggravating factors. Tolerating LEV500mg  twice a day. Etoh use continues.   PRIOR HPI (04/23/18): 61 year old female with history of alcohol abuse, here for evaluation of seizures.   Patient drinks at least 4 alcoholic drinks per day.  She is done this for many years.  In February 2019 patient stopped drinking for 3 days and then had 2 seizures in the night during her sleep.  She was shaking, had incontinence and then vomiting.  She had headache and felt confused afterwards.   July 2019 patient had another episode of convulsions, tongue biting, incontinence and postictal confusion.  Patient had been drinking alcohol in the days leading up to this and had not cut down or quit.   No family history of seizures.  No recent injuries, infections or traumas.   REVIEW OF SYSTEMS: Out of a complete 14 system review of symptoms, the patient complains only of the following symptoms, none and all other reviewed systems are negative.   ALLERGIES: Allergies  Allergen Reactions   Asa [Aspirin]     Headache, stomach issues     HOME MEDICATIONS: Outpatient Medications Prior to Visit  Medication Sig Dispense Refill    levETIRAcetam (KEPPRA) 500 MG tablet Take 1 tablet (500 mg total) by mouth 2 (two) times daily. 180 tablet 0   No facility-administered medications prior to visit.     PAST MEDICAL HISTORY: Past Medical History:  Diagnosis Date   Bunion    Chest pain    Headache    Obesity    Osteoarthritis    Paresthesia of both hands    Seizures (HCC) 04/2018   Tobacco abuse      PAST SURGICAL HISTORY: Past Surgical History:  Procedure Laterality Date   CHOLECYSTECTOMY, LAPAROSCOPIC     LAPAROSCOPIC CHOLECYSTECTOMY  1998   TOTAL ABDOMINAL HYSTERECTOMY  1991   TUBAL LIGATION       FAMILY HISTORY: Family History  Problem Relation Age of Onset   Hypertension Mother    Diabetes Mother    Heart failure Mother      SOCIAL HISTORY: Social History   Socioeconomic History   Marital status: Married    Spouse name: Not on file   Number of children: Not on file   Years of education: Not on file   Highest education level: Not on file  Occupational History   Not on file  Tobacco Use   Smoking status: Former    Types: Cigarettes    Quit date: 10/16/2014    Years since quitting: 7.0   Smokeless tobacco: Never  Substance and Sexual Activity   Alcohol use: Yes    Comment: 4 daily   Drug use: No  Sexual activity: Not Currently  Other Topics Concern   Not on file  Social History Narrative   ** Merged History Encounter **       Lives home, with husband, Works at [employer].  Education: Chief Operating Officer.   Caffeine Rare Decaff.     Social Determinants of Health   Financial Resource Strain: Not on file  Food Insecurity: Not on file  Transportation Needs: Not on file  Physical Activity: Not on file  Stress: Not on file  Social Connections: Not on file  Intimate Partner Violence: Not on file      PHYSICAL EXAM  There were no vitals filed for this visit.  There is no height or weight on file to calculate BMI.   Generalized: Well developed, in no acute  distress  Cardiology: normal rate and rhythm, no murmur auscultated  Respiratory: clear to auscultation bilaterally    Neurological examination  Mentation: Alert oriented to time, place, history taking. Follows all commands speech and language fluent Cranial nerve II-XII: Pupils were equal round reactive to light. Extraocular movements were full, visual field were full on confrontational test. Facial sensation and strength were normal. Head turning and shoulder shrug  were normal and symmetric. Motor: The motor testing reveals 5 over 5 strength of all 4 extremities. Good symmetric motor tone is noted throughout.  Sensory: Sensory testing is intact to soft touch on all 4 extremities. No evidence of extinction is noted.  Coordination: Cerebellar testing reveals good finger-nose-finger and heel-to-shin bilaterally.  Gait and station: Gait is normal.  Reflexes: Deep tendon reflexes are symmetric and normal bilaterally.     DIAGNOSTIC DATA (LABS, IMAGING, TESTING) - I reviewed patient records, labs, notes, testing and imaging myself where available.  No results found for: WBC, HGB, HCT, MCV, PLT No results found for: NA, K, CL, CO2, GLUCOSE, BUN, CREATININE, CALCIUM, PROT, ALBUMIN, AST, ALT, ALKPHOS, BILITOT, GFRNONAA, GFRAA No results found for: CHOL, HDL, LDLCALC, LDLDIRECT, TRIG, CHOLHDL No results found for: WHQP5F No results found for: VITAMINB12 No results found for: TSH  No flowsheet data found.   ASSESSMENT AND PLAN  61 y.o. year old female  has a past medical history of Bunion, Chest pain, Headache, Obesity, Osteoarthritis, Paresthesia of both hands, Seizures (HCC) (04/2018), and Tobacco abuse. here with   No diagnosis found.  Leanah is doing very well from a seizure standpoint.  We will continue levetiracetam 500 mg twice daily.  We have discussed blood pressure reading in the office.  She has an appointment with primary care today to review.  We have reviewed possible  contributors including anxiety, alcohol intake, and use of canned vegetables.  She will continue close follow-up with primary care to manage as appropriate.  She was encouraged to discuss any concerns of anxiety with PCP.  Healthy lifestyle habits encouraged.  Seizure precautions reviewed.  She was advised to avoid abrupt discontinuation of alcohol use but encouraged to drink in moderation.  She should not drive when drinking alcohol.  She will follow-up with me in 1 year, sooner if needed.  She verbalizes understanding and agreement with this plan.  No orders of the defined types were placed in this encounter.  No orders of the defined types were placed in this encounter.   Shawnie Dapper, MSN, FNP-C 11/09/2021, 2:15 PM  [facility] [address], [address] [place], [place] [address] [phone]

## 2021-11-09 NOTE — Patient Instructions (Incomplete)

## 2021-11-10 ENCOUNTER — Ambulatory Visit: Payer: 59 | Admitting: Family Medicine

## 2021-11-10 ENCOUNTER — Encounter: Payer: Self-pay | Admitting: Family Medicine

## 2022-05-09 ENCOUNTER — Ambulatory Visit: Payer: 59 | Admitting: Family Medicine

## 2022-05-15 NOTE — Progress Notes (Deleted)
No chief complaint on file.    HISTORY OF PRESENT ILLNESS:  05/15/22 ALL: Jamie Reed returns for follow up for seizures. She continues levetiracetam 500mg  BID.   11/10/2020 ALL: Jamie Reed is a 61 y.o. female here today for follow up for seizures. She continues levetiracetam 500mg  BID. Last seizure 2019. She has had some concerns of elevated BP. She is scheduled to see PCP today to recheck. She feels elevated readings are related to anxiety from having to drive to work. She is looking for another job where she does not have to drive as far. She continues to drink alcohol regularly and reports trying to cut back.    HISTORY (copied from Dr previous note)  UPDATE (05/13/19, VRP): Since last visit, doing well. Symptoms are stable. No further seizures. No alleviating or aggravating factors. Tolerating LEV500mg  twice a day. Etoh use continues.   PRIOR HPI (04/23/18): 61 year old female with history of alcohol abuse, here for evaluation of seizures.   Patient drinks at least 4 alcoholic drinks per day.  She is done this for many years.  In February 2019 patient stopped drinking for 3 days and then had 2 seizures in the night during her sleep.  She was shaking, had incontinence and then vomiting.  She had headache and felt confused afterwards.   July 2019 patient had another episode of convulsions, tongue biting, incontinence and postictal confusion.  Patient had been drinking alcohol in the days leading up to this and had not cut down or quit.   No family history of seizures.  No recent injuries, infections or traumas.   REVIEW OF SYSTEMS: Out of a complete 14 system review of symptoms, the patient complains only of the following symptoms, none and all other reviewed systems are negative.   ALLERGIES: Allergies  Allergen Reactions   Asa [Aspirin]     Headache, stomach issues     HOME MEDICATIONS: Outpatient Medications Prior to Visit  Medication Sig Dispense Refill    levETIRAcetam (KEPPRA) 500 MG tablet Take 1 tablet (500 mg total) by mouth 2 (two) times daily. 180 tablet 0   No facility-administered medications prior to visit.     PAST MEDICAL HISTORY: Past Medical History:  Diagnosis Date   Bunion    Chest pain    Headache    Obesity    Osteoarthritis    Paresthesia of both hands    Seizures (HCC) 04/2018   Tobacco abuse      PAST SURGICAL HISTORY: Past Surgical History:  Procedure Laterality Date   CHOLECYSTECTOMY, LAPAROSCOPIC     LAPAROSCOPIC CHOLECYSTECTOMY  1998   TOTAL ABDOMINAL HYSTERECTOMY  1991   TUBAL LIGATION       FAMILY HISTORY: Family History  Problem Relation Age of Onset   Hypertension Mother    Diabetes Mother    Heart failure Mother      SOCIAL HISTORY: Social History   Socioeconomic History   Marital status: Married    Spouse name: Not on file   Number of children: Not on file   Years of education: Not on file   Highest education level: Not on file  Occupational History   Not on file  Tobacco Use   Smoking status: Former    Types: Cigarettes    Quit date: 10/16/2014    Years since quitting: 7.5   Smokeless tobacco: Never  Substance and Sexual Activity   Alcohol use: Yes    Comment: 4 daily   Drug use: No  Sexual activity: Not Currently  Other Topics Concern   Not on file  Social History Narrative   ** Merged History Encounter **       Lives home, with husband, Works at [employer].  Education: Chief Operating Officer.   Caffeine Rare Decaff.     Social Determinants of Health   Financial Resource Strain: Not on file  Food Insecurity: Not on file  Transportation Needs: Not on file  Physical Activity: Not on file  Stress: Not on file  Social Connections: Not on file  Intimate Partner Violence: Not on file      PHYSICAL EXAM  There were no vitals filed for this visit.  There is no height or weight on file to calculate BMI.   Generalized: Well developed, in no acute  distress  Cardiology: normal rate and rhythm, no murmur auscultated  Respiratory: clear to auscultation bilaterally    Neurological examination  Mentation: Alert oriented to time, place, history taking. Follows all commands speech and language fluent Cranial nerve II-XII: Pupils were equal round reactive to light. Extraocular movements were full, visual field were full on confrontational test. Facial sensation and strength were normal. Head turning and shoulder shrug  were normal and symmetric. Motor: The motor testing reveals 5 over 5 strength of all 4 extremities. Good symmetric motor tone is noted throughout.  Sensory: Sensory testing is intact to soft touch on all 4 extremities. No evidence of extinction is noted.  Coordination: Cerebellar testing reveals good finger-nose-finger and heel-to-shin bilaterally.  Gait and station: Gait is normal.  Reflexes: Deep tendon reflexes are symmetric and normal bilaterally.     DIAGNOSTIC DATA (LABS, IMAGING, TESTING) - I reviewed patient records, labs, notes, testing and imaging myself where available.  No results found for: "WBC", "HGB", "HCT", "MCV", "PLT" No results found for: "NA", "K", "CL", "CO2", "GLUCOSE", "BUN", "CREATININE", "CALCIUM", "PROT", "ALBUMIN", "AST", "ALT", "ALKPHOS", "BILITOT", "GFRNONAA", "GFRAA" No results found for: "CHOL", "HDL", "LDLCALC", "LDLDIRECT", "TRIG", "CHOLHDL" No results found for: "HGBA1C" No results found for: "VITAMINB12" No results found for: "TSH"      No data to display           ASSESSMENT AND PLAN  61 y.o. year old female  has a past medical history of Bunion, Chest pain, Headache, Obesity, Osteoarthritis, Paresthesia of both hands, Seizures (HCC) (04/2018), and Tobacco abuse. here with   No diagnosis found.  Jamie Reed is doing very well from a seizure standpoint.  We will continue levetiracetam 500 mg twice daily.  We have discussed blood pressure reading in the office.  She has an  appointment with primary care today to review.  We have reviewed possible contributors including anxiety, alcohol intake, and use of canned vegetables.  She will continue close follow-up with primary care to manage as appropriate.  She was encouraged to discuss any concerns of anxiety with PCP.  Healthy lifestyle habits encouraged.  Seizure precautions reviewed.  She was advised to avoid abrupt discontinuation of alcohol use but encouraged to drink in moderation.  She should not drive when drinking alcohol.  She will follow-up with me in 1 year, sooner if needed.  She verbalizes understanding and agreement with this plan.  No orders of the defined types were placed in this encounter.  No orders of the defined types were placed in this encounter.   Shawnie Dapper, MSN, FNP-C 05/15/2022, 9:06 AM  [facility] [address], [address] [place], [place] [address] [phone]

## 2022-05-16 ENCOUNTER — Telehealth: Payer: Self-pay | Admitting: Family Medicine

## 2022-05-16 ENCOUNTER — Ambulatory Visit: Payer: 59 | Admitting: Family Medicine

## 2022-05-16 DIAGNOSIS — G40909 Epilepsy, unspecified, not intractable, without status epilepticus: Secondary | ICD-10-CM

## 2022-05-16 NOTE — Telephone Encounter (Signed)
Tried calling pt twice. Unable to leave message. If she calls back, please schedule sooner appt than December.  She needs to come in sooner for f/u.

## 2022-05-16 NOTE — Telephone Encounter (Signed)
Pt called and said she work late last night and over slept this morning. Pt said she can't make her appointment today.

## 2022-09-21 NOTE — Patient Instructions (Signed)
Below is our plan:  We will continue levetiracetam 500mg twice daily   Please make sure you are consistent with timing of seizure medication. I recommend annual visit with primary care provider (PCP) for complete physical and routine blood work. I recommend daily intake of vitamin D (400-800iu) and calcium (800-1000mg) for bone health. Discuss Dexa screening with PCP.   According to [place] law, you can not drive unless you are seizure / syncope free for at least 6 months and under physician's care.  Please maintain precautions. Do not participate in activities where a loss of awareness could harm you or someone else. No swimming alone, no tub bathing, no hot tubs, no driving, no operating motorized vehicles (cars, ATVs, motocycles, etc), lawnmowers, power tools or firearms. No standing at heights, such as rooftops, ladders or stairs. Avoid hot objects such as stoves, heaters, open fires. Wear a helmet when riding a bicycle, scooter, skateboard, etc. and avoid areas of traffic. Set your water heater to 120 degrees or less.  Please make sure you are staying well hydrated. I recommend 50-60 ounces daily. Well balanced diet and regular exercise encouraged. Consistent sleep schedule with 6-8 hours recommended.   Please continue follow up with care team as directed.   Follow up with me in 1 year  You may receive a survey regarding today's visit. I encourage you to leave honest feed back as I do use this information to improve patient care. Thank you for seeing me today!    

## 2022-09-21 NOTE — Progress Notes (Signed)
Chief Complaint  Patient presents with   Follow-up    Pt in room #1 and alone.  Pt here today for f/u Seizures.   HISTORY OF PRESENT ILLNESS:  09/25/22 ALL: Jamie Reed returns for follow up for seizures. She continues levetiracetam 500mg  BID. She denies seizure activity. She rarely misses a dose of lev. She continues to drink 3-4 beers at night. She reports BP is "good". She is seen regularly by PCP. Next appt for CPE next week. She is driving without difficulty. She works three jobs.   11/10/2020 ALL: Jamie Reed is a 61 y.o. female here today for follow up for seizures. She continues levetiracetam 500mg  BID. Last seizure 2019. She has had some concerns of elevated BP. She is scheduled to see PCP today to recheck. She feels elevated readings are related to anxiety from having to drive to work. She is looking for another job where she does not have to drive as far. She continues to drink alcohol regularly and reports trying to cut back.   HISTORY (copied from Dr previous note)  UPDATE (05/13/19, VRP): Since last visit, doing well. Symptoms are stable. No further seizures. No alleviating or aggravating factors. Tolerating LEV500mg  twice a day. Etoh use continues.   PRIOR HPI (04/23/18): 61 year old female with history of alcohol abuse, here for evaluation of seizures.   Patient drinks at least 4 alcoholic drinks per day.  She is done this for many years.  In February 2019 patient stopped drinking for 3 days and then had 2 seizures in the night during her sleep.  She was shaking, had incontinence and then vomiting.  She had headache and felt confused afterwards.   July 2019 patient had another episode of convulsions, tongue biting, incontinence and postictal confusion.  Patient had been drinking alcohol in the days leading up to this and had not cut down or quit.   No family history of seizures.  No recent injuries, infections or traumas.   REVIEW OF SYSTEMS: Out of a complete 14  system review of symptoms, the patient complains only of the following symptoms, none and all other reviewed systems are negative.   ALLERGIES: Allergies  Allergen Reactions   Asa [Aspirin]     Headache, stomach issues     HOME MEDICATIONS: Outpatient Medications Prior to Visit  Medication Sig Dispense Refill   levETIRAcetam (KEPPRA) 500 MG tablet Take 1 tablet (500 mg total) by mouth 2 (two) times daily. 180 tablet 0   No facility-administered medications prior to visit.     PAST MEDICAL HISTORY: Past Medical History:  Diagnosis Date   Bunion    Chest pain    Headache    Obesity    Osteoarthritis    Paresthesia of both hands    Seizures (HCC) 04/2018   Tobacco abuse      PAST SURGICAL HISTORY: Past Surgical History:  Procedure Laterality Date   CHOLECYSTECTOMY, LAPAROSCOPIC     LAPAROSCOPIC CHOLECYSTECTOMY  1998   TOTAL ABDOMINAL HYSTERECTOMY  1991   TUBAL LIGATION       FAMILY HISTORY: Family History  Problem Relation Age of Onset   Hypertension Mother    Diabetes Mother    Heart failure Mother      SOCIAL HISTORY: Social History   Socioeconomic History   Marital status: Married    Spouse name: Not on file   Number of children: Not on file   Years of education: Not on file   Highest education level:  Not on file  Occupational History   Not on file  Tobacco Use   Smoking status: Former    Types: Cigarettes    Quit date: 10/16/2014    Years since quitting: 7.9   Smokeless tobacco: Never  Substance and Sexual Activity   Alcohol use: Yes    Comment: 4 daily   Drug use: No   Sexual activity: Not Currently  Other Topics Concern   Not on file  Social History Narrative   ** Merged History Encounter **       Lives home, with husband, Works at [employer].  Education: Buyer, retail.   Caffeine Rare Decaff.     Social Determinants of Health   Financial Resource Strain: Not on file  Food Insecurity: Not on file  Transportation Needs: Not on  file  Physical Activity: Not on file  Stress: Not on file  Social Connections: Not on file  Intimate Partner Violence: Not on file      PHYSICAL EXAM  Vitals:   09/25/22 1438  BP: 136/81  Pulse: (!) 101  Weight: 210 lb 8 oz (95.5 kg)  Height: 5\' 5"  (1.651 m)    Body mass index is 35.03 kg/m.   Generalized: Well developed, in no acute distress  Cardiology: normal rate and rhythm, no murmur auscultated  Respiratory: clear to auscultation bilaterally    Neurological examination  Mentation: Alert oriented to time, place, history taking. Follows all commands speech and language fluent Cranial nerve II-XII: Pupils were equal round reactive to light. Extraocular movements were full, visual field were full on confrontational test. Facial sensation and strength were normal. Head turning and shoulder shrug  were normal and symmetric. Motor: The motor testing reveals 5 over 5 strength of all 4 extremities. Good symmetric motor tone is noted throughout.  Sensory: Sensory testing is intact to soft touch on all 4 extremities. No evidence of extinction is noted.  Coordination: Cerebellar testing reveals good finger-nose-finger and heel-to-shin bilaterally.  Gait and station: Gait is normal.  Reflexes: Deep tendon reflexes are symmetric and normal bilaterally.     DIAGNOSTIC DATA (LABS, IMAGING, TESTING) - I reviewed patient records, labs, notes, testing and imaging myself where available.  No results found for: "WBC", "HGB", "HCT", "MCV", "PLT" No results found for: "NA", "K", "CL", "CO2", "GLUCOSE", "BUN", "CREATININE", "CALCIUM", "PROT", "ALBUMIN", "AST", "ALT", "ALKPHOS", "BILITOT", "GFRNONAA", "GFRAA" No results found for: "CHOL", "HDL", "LDLCALC", "LDLDIRECT", "TRIG", "CHOLHDL" No results found for: "HGBA1C" No results found for: "VITAMINB12" No results found for: "TSH"      No data to display           ASSESSMENT AND PLAN  61 y.o. year old female  has a past medical  history of Bunion, Chest pain, Headache, Obesity, Osteoarthritis, Paresthesia of both hands, Seizures ([place]) (04/2018), and Tobacco abuse. here with   No diagnosis found.  [name] is doing very well from a seizure standpoint.  We will continue levetiracetam 500 mg twice daily. Healthy lifestyle habits encouraged.  Seizure precautions reviewed.  She was advised to avoid abrupt discontinuation of alcohol use but encouraged to drink in moderation.  She should not drive when drinking alcohol. Continue regular follow up with PCP. She will follow-up with me in 1 year, sooner if needed.  She verbalizes understanding and agreement with this plan.   No orders of the defined types were placed in this encounter.  Meds ordered this encounter  Medications   levETIRAcetam (KEPPRA) 500 MG tablet  Sig: Take 1 tablet (500 mg total) by mouth 2 (two) times daily.    Dispense:  180 tablet    Refill:  3    Order Specific Question:   Supervising Provider    Answer:   Melvenia Beam [id number]     Debbora Presto, MSN, FNP-C 09/25/2022, 2:54 PM  [facility] [address] [place], [place] [address] [phone]

## 2022-09-25 ENCOUNTER — Encounter: Payer: Self-pay | Admitting: Family Medicine

## 2022-09-25 ENCOUNTER — Ambulatory Visit (INDEPENDENT_AMBULATORY_CARE_PROVIDER_SITE_OTHER): Payer: 59 | Admitting: Family Medicine

## 2022-09-25 VITALS — BP 136/81 | HR 101 | Ht 65.0 in | Wt 210.5 lb

## 2022-09-25 DIAGNOSIS — G40909 Epilepsy, unspecified, not intractable, without status epilepticus: Secondary | ICD-10-CM

## 2022-09-25 MED ORDER — LEVETIRACETAM 500 MG PO TABS: 500.0000 mg | ORAL_TABLET | Freq: Two times a day (BID) | ORAL | 3 refills | Status: DC

## 2022-11-09 ENCOUNTER — Other Ambulatory Visit: Payer: Self-pay | Admitting: Family Medicine

## 2022-11-09 DIAGNOSIS — Z1231 Encounter for screening mammogram for malignant neoplasm of breast: Secondary | ICD-10-CM

## 2022-11-21 ENCOUNTER — Ambulatory Visit
Admission: RE | Admit: 2022-11-21 | Discharge: 2022-11-21 | Disposition: A | Discharge status: Home / Self Care | Payer: 59 | Source: Ambulatory Visit | Attending: Family Medicine | Admitting: Family Medicine

## 2022-11-21 DIAGNOSIS — Z1231 Encounter for screening mammogram for malignant neoplasm of breast: Secondary | ICD-10-CM

## 2023-05-16 IMAGING — MG MM DIGITAL SCREENING BILAT W/ TOMO AND CAD
6 of 10 series · 6 of 30 positions shown · non-contrast
Comparison: Previous exam(s).

CLINICAL DATA: Screening.

EXAM:
DIGITAL SCREENING BILATERAL MAMMOGRAM WITH TOMOSYNTHESIS AND CAD
TECHNIQUE: Bilateral screening digital craniocaudal and mediolateral oblique
mammograms were obtained. Bilateral screening digital breast
tomosynthesis was performed. The images were evaluated with
computer-aided detection.

[L MLO synth-2D (1 of 2)]
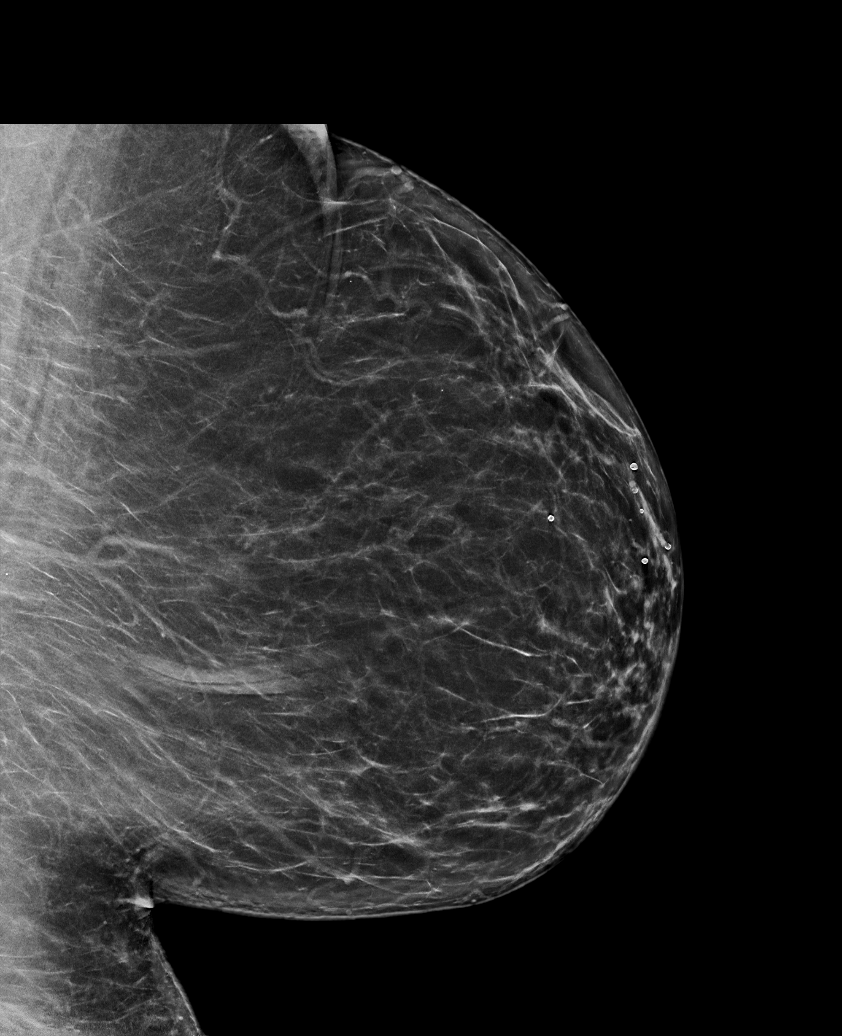

[R CC synth-2D]
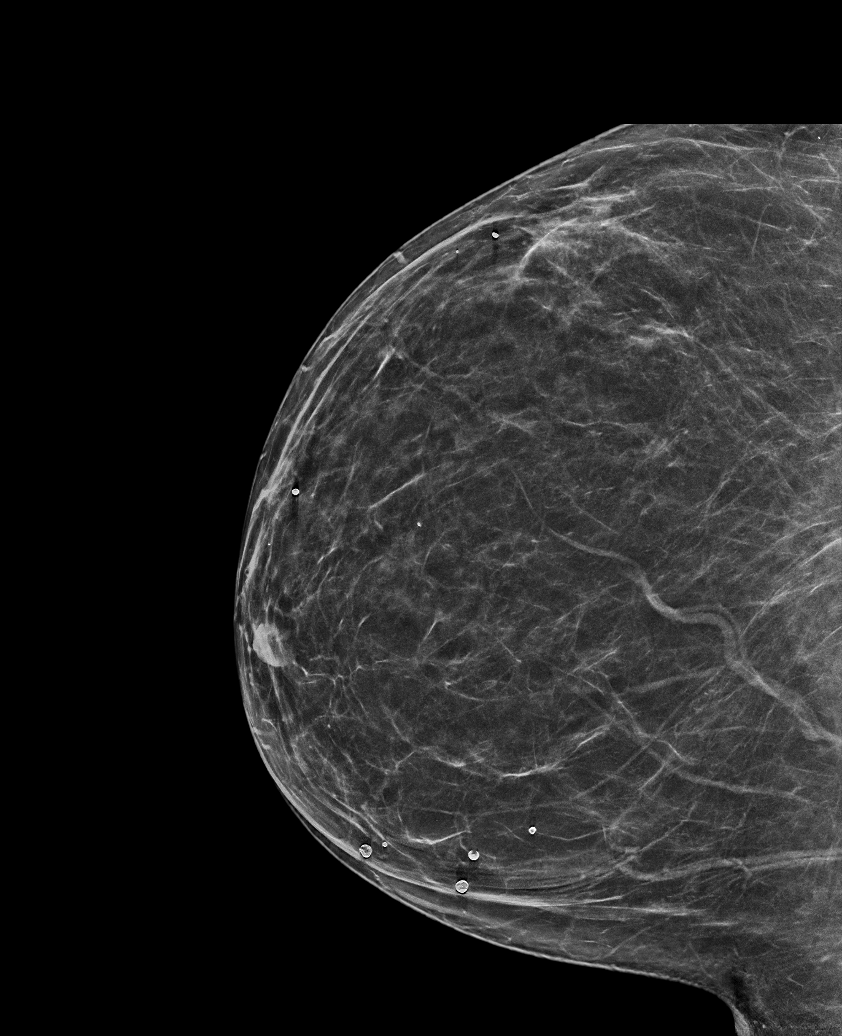

[L CC synth-2D]
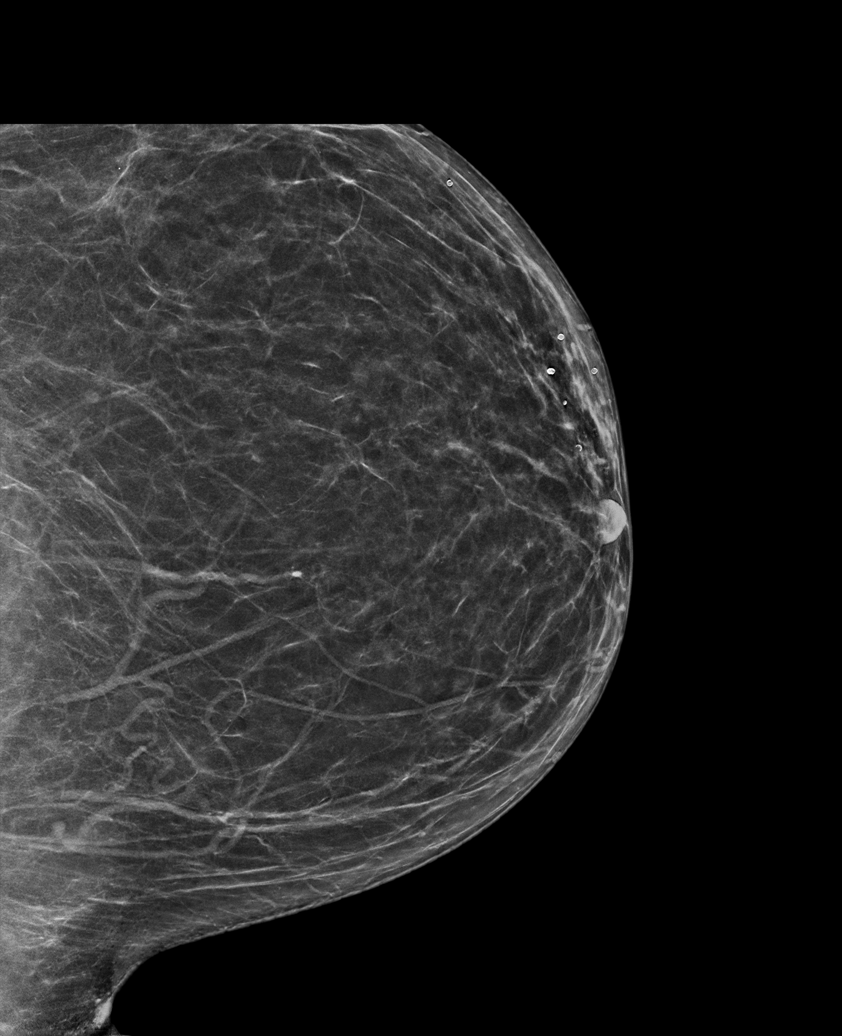

[L MLO synth-2D (2 of 2)]
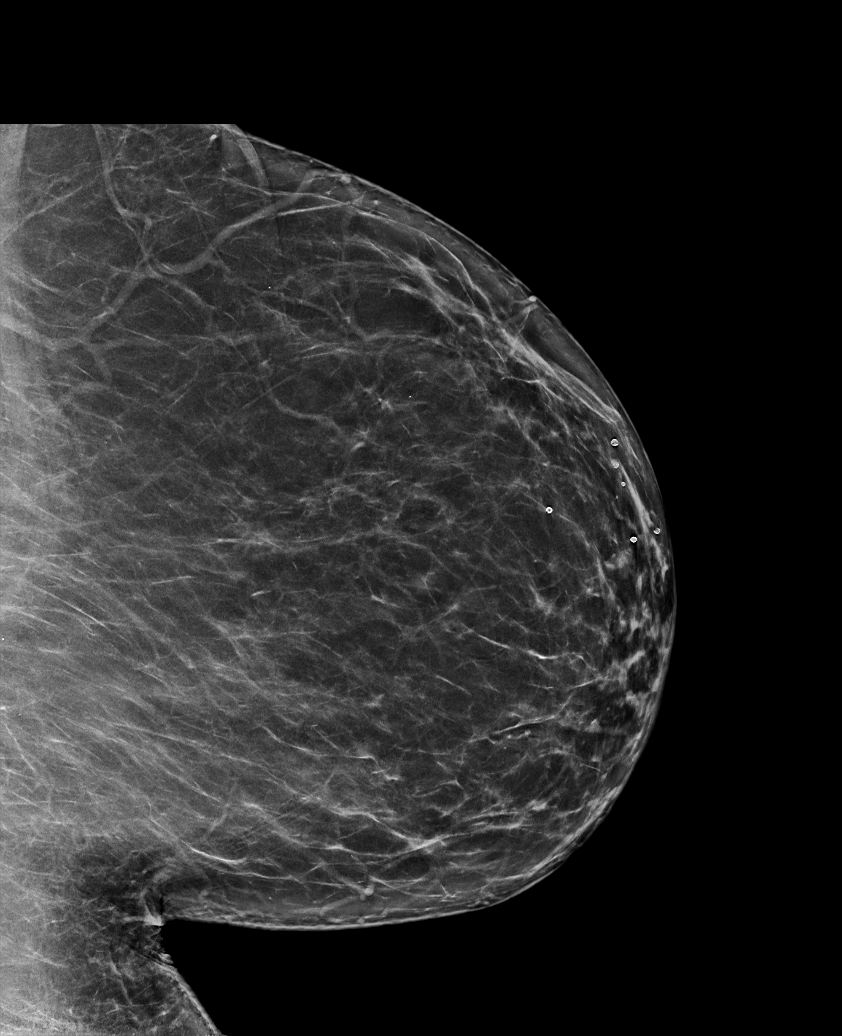

[R MLO synth-2D]
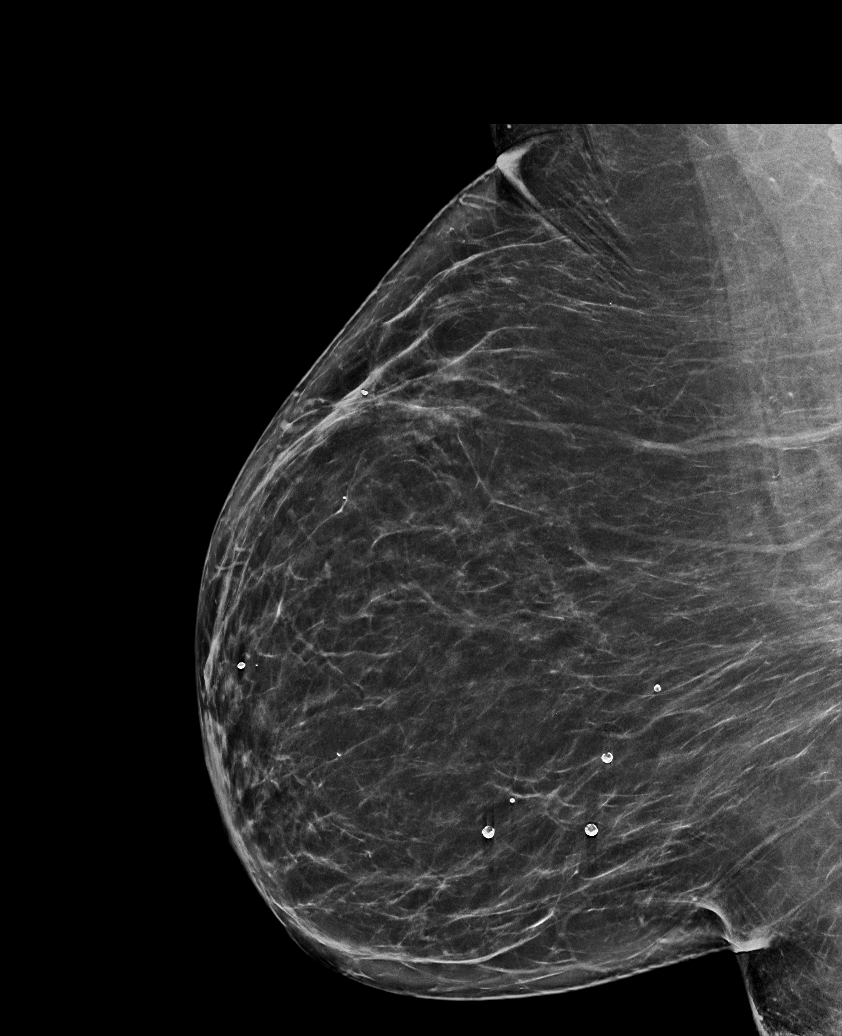

[L MLO tomo · tomo slice 41/82.0]
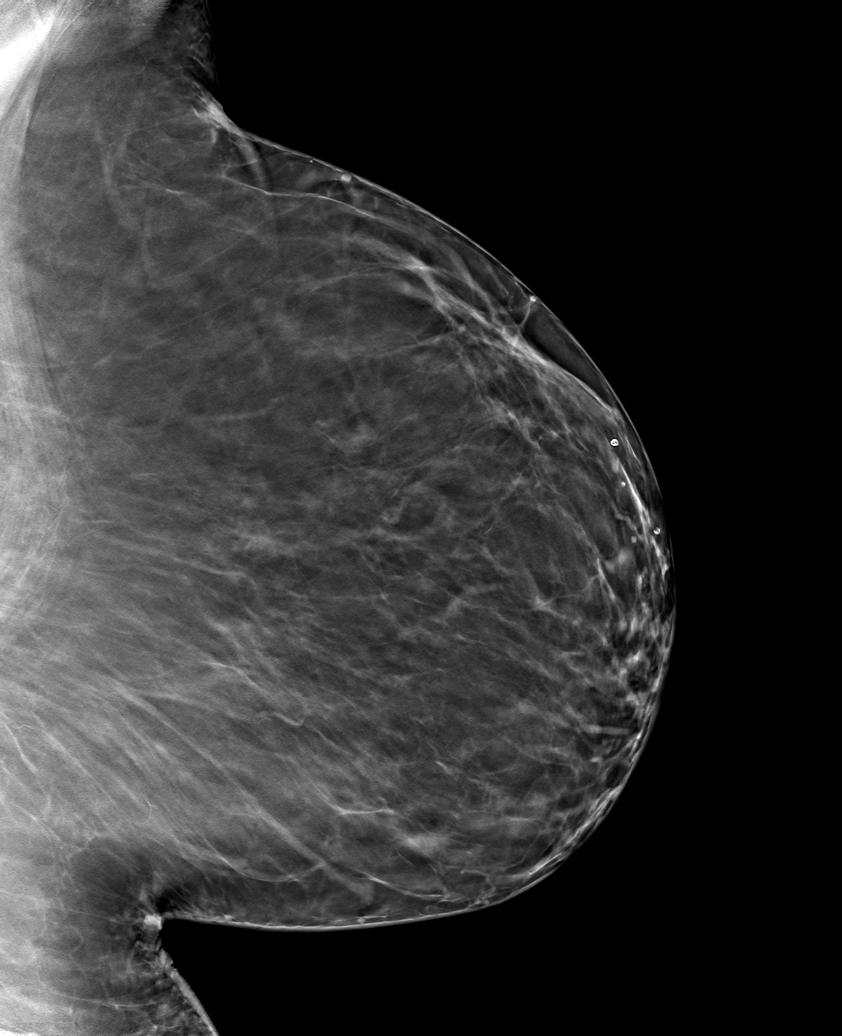

[6 of 30 positions shown; findings below may reference images not displayed]

ACR Breast Density Category b: There are scattered areas of
fibroglandular density.
FINDINGS: There are no findings suspicious for malignancy.
IMPRESSION: No mammographic evidence of malignancy. A result letter of this
screening mammogram will be mailed directly to the patient.

RECOMMENDATION:
Screening mammogram in one year. (Code:51-O-LD2)

BI-RADS CATEGORY  1: Negative.

## 2023-06-04 ENCOUNTER — Telehealth: Payer: Self-pay | Admitting: Family Medicine

## 2023-06-04 NOTE — Telephone Encounter (Signed)
I attempted to contact the patient via telephone. She was unavailable to be contacted at this time, no voicemail. I will call the patient back later.

## 2023-06-04 NOTE — Telephone Encounter (Signed)
Pt is asking for a call to discuss levETIRAcetam (KEPPRA) 500 MG tablet, she believes this medication is affecting her memory, please call pt to discuss.

## 2023-06-05 NOTE — Telephone Encounter (Signed)
2nd attempt, unable to lvm.

## 2023-06-07 NOTE — Telephone Encounter (Signed)
3rd attempt unable to leave voicemail.  Encounter closed.

## 2023-09-18 ENCOUNTER — Other Ambulatory Visit: Payer: Self-pay

## 2023-09-18 MED ORDER — LEVETIRACETAM 500 MG PO TABS: 500.0000 mg | ORAL_TABLET | Freq: Two times a day (BID) | ORAL | 0 refills | Status: DC

## 2023-09-26 NOTE — Patient Instructions (Incomplete)
Below is our plan:  We will continue levetiracetam 500mg  twice daily. Continue to monitor memory.   Please make sure you are consistent with timing of seizure medication. I recommend annual visit with primary care provider (PCP) for complete physical and routine blood work. I recommend daily intake of vitamin D (400-800iu) and calcium (800-1000mg ) for bone health. Discuss Dexa screening with PCP.   According to [place] law, you can not drive unless you are seizure / syncope free for at least 6 months and under physician's care.  Please maintain precautions. Do not participate in activities where a loss of awareness could harm you or someone else. No swimming alone, no tub bathing, no hot tubs, no driving, no operating motorized vehicles (cars, ATVs, motocycles, etc), lawnmowers, power tools or firearms. No standing at heights, such as rooftops, ladders or stairs. Avoid hot objects such as stoves, heaters, open fires. Wear a helmet when riding a bicycle, scooter, skateboard, etc. and avoid areas of traffic. Set your water heater to 120 degrees or less.  SUDEP is the sudden, unexpected death of someone with epilepsy, who was otherwise healthy. In SUDEP cases, no other cause of death is found when an autopsy is done. Each year, more than 1 in 1,000 people with epilepsy die from SUDEP. This is the leading cause of death in people with uncontrolled seizures. Until further answers are available, the best way to prevent SUDEP is to lower your risk by controlling seizures. Research has found that people with all types of epilepsy that experience convulsive seizures can be at risk.  Please make sure you are staying well hydrated. I recommend 50-60 ounces daily. Well balanced diet and regular exercise encouraged. Consistent sleep schedule with 6-8 hours recommended.   Please continue follow up with care team as directed.   Follow up with me in 1 year   You may receive a survey regarding today's visit. I encourage  you to leave honest feed back as I do use this information to improve patient care. Thank you for seeing me today!   Management of Memory Problems   There are some general things you can do to help manage your memory problems.  Your memory may not in fact recover, but by using techniques and strategies you will be able to manage your memory difficulties better.   1)  Establish a routine. Try to establish and then stick to a regular routine.  By doing this, you will get used to what to expect and you will reduce the need to rely on your memory.  Also, try to do things at the same time of day, such as taking your medication or checking your calendar first thing in the morning. Think about think that you can do as a part of a regular routine and make a list.  Then enter them into a daily planner to remind you.  This will help you establish a routine.   2)  Organize your environment. Organize your environment so that it is uncluttered.  Decrease visual stimulation.  Place everyday items such as keys or cell phone in the same place every day (ie.  Basket next to front door) Use post it notes with a brief message to yourself (ie. Turn off light, lock the door) Use labels to indicate where things go (ie. Which cupboards are for food, dishes, etc.) Keep a notepad and pen by the telephone to take messages   3)  Memory Aids A diary or journal/notebook/daily planner Making a list (  shopping list, chore list, to do list that needs to be done) Using an alarm as a reminder (kitchen timer or cell phone alarm) Using cell phone to store information (Notes, Calendar, Reminders) Calendar/White board placed in a prominent position Post-it notes   In order for memory aids to be useful, you need to have good habits.  It's no good remembering to make a note in your journal if you don't remember to look in it.  Try setting aside a certain time of day to look in journal.   4)  Improving mood and managing  fatigue. There may be other factors that contribute to memory difficulties.  Factors, such as anxiety, depression and tiredness can affect memory. Regular gentle exercise can help improve your mood and give you more energy. Exercise: there are short videos created by the [employer] on Health specially for older adults: https://bit.ly/2I30q97.  Mediterranean diet: which emphasizes fruits, vegetables, whole grains, legumes, fish, and other seafood; unsaturated fats such as olive oils; and low amounts of red meat, eggs, and sweets. A variation of this, called MIND (Mediterranean-DASH Intervention for Neurodegenerative Delay) incorporates the DASH (Dietary Approaches to Stop Hypertension) diet, which has been shown to lower high blood pressure, a risk factor for Alzheimer's disease. More information at: ExitMarketing.de.  Aerobic exercise that improve heart health is also good for the mind.  [employer] on Aging have short videos for exercises that you can do at home: BlindWorkshop.com.pt Simple relaxation techniques may help relieve symptoms of anxiety Try to get back to completing activities or hobbies you enjoyed doing in the past. Learn to pace yourself through activities to decrease fatigue. Find out about some local support groups where you can share experiences with others. Try and achieve 7-8 hours of sleep at night.   Tasks to improve attention/working memory 1. Good sleep hygiene (7-8 hrs of sleep) 2. Learning a new skill (Painting, Carpentry, Pottery, new language, Knitting). 3.Cognitive exercises (keep a daily journal, Puzzles) 4. Physical exercise and training  (30 min/day X 4 days week) 5. Being on Antidepressant if needed 6.Yoga, Meditation, Tai Chi 7. Decrease alcohol intake 8.Have a clear schedule and structure in daily routine   MIND Diet: The Mediterranean-DASH Diet Intervention for  Neurodegenerative Delay, or MIND diet, targets the health of the aging brain. Research participants with the highest MIND diet scores had a significantly slower rate of cognitive decline compared with those with the lowest scores. The effects of the MIND diet on cognition showed greater effects than either the Mediterranean or the DASH diet alone.   The healthy items the MIND diet guidelines suggest include:   3+ servings a day of whole grains 1+ servings a day of vegetables (other than green leafy) 6+ servings a week of green leafy vegetables 5+ servings a week of nuts 4+ meals a week of beans 2+ servings a week of berries 2+ meals a week of poultry 1+ meals a week of fish Mainly olive oil if added fat is used   The unhealthy items, which are higher in saturated and trans fat, include: Less than 5 servings a week of pastries and sweets Less than 4 servings a week of red meat (including beef, pork, lamb, and products made from these meats) Less than one serving a week of cheese and fried foods Less than 1 tablespoon a day of butter/stick margarine

## 2023-09-26 NOTE — Progress Notes (Unsigned)
No chief complaint on file.  HISTORY OF PRESENT ILLNESS:  09/26/23 ALL: Jamie Reed returns for follow up for seizures. She was last seen 09/2022 and doing well. She was advised to limit alcohol use and continue levetiracetam 500mg  BID. She called 06/2023 reporting concerns of lev effecting memory. Staff attempted three times to reach her with no success.   Since,   09/25/2022 ALL:  Jamie Reed returns for follow up for seizures. She continues levetiracetam 500mg  BID. She denies seizure activity. She rarely misses a dose of lev. She continues to drink 3-4 beers at night. She reports BP is "good". She is seen regularly by PCP. Next appt for CPE next week. She is driving without difficulty. She works three jobs.   11/10/2020 ALL: Jamie Reed is a 62 y.o. female here today for follow up for seizures. She continues levetiracetam 500mg  BID. Last seizure 2019. She has had some concerns of elevated BP. She is scheduled to see PCP today to recheck. She feels elevated readings are related to anxiety from having to drive to work. She is looking for another job where she does not have to drive as far. She continues to drink alcohol regularly and reports trying to cut back.   HISTORY (copied from Dr Richrd Humbles previous note)  UPDATE (05/13/19, VRP): Since last visit, doing well. Symptoms are stable. No further seizures. No alleviating or aggravating factors. Tolerating LEV500mg  twice a day. Etoh use continues.   PRIOR HPI (04/23/18): 62 year old female with history of alcohol abuse, here for evaluation of seizures.   Patient drinks at least 4 alcoholic drinks per day.  She is done this for many years.  In February 2019 patient stopped drinking for 3 days and then had 2 seizures in the night during her sleep.  She was shaking, had incontinence and then vomiting.  She had headache and felt confused afterwards.   July 2019 patient had another episode of convulsions, tongue biting, incontinence and postictal  confusion.  Patient had been drinking alcohol in the days leading up to this and had not cut down or quit.   No family history of seizures.  No recent injuries, infections or traumas.   REVIEW OF SYSTEMS: Out of a complete 14 system review of symptoms, the patient complains only of the following symptoms, none and all other reviewed systems are negative.   ALLERGIES: Allergies  Allergen Reactions   Asa [Aspirin]     Headache, stomach issues     HOME MEDICATIONS: Outpatient Medications Prior to Visit  Medication Sig Dispense Refill   levETIRAcetam (KEPPRA) 500 MG tablet Take 1 tablet (500 mg total) by mouth 2 (two) times daily. 180 tablet 0   No facility-administered medications prior to visit.     PAST MEDICAL HISTORY: Past Medical History:  Diagnosis Date   Bunion    Chest pain    Headache    Obesity    Osteoarthritis    Paresthesia of both hands    Seizures (HCC) 04/2018   Tobacco abuse      PAST SURGICAL HISTORY: Past Surgical History:  Procedure Laterality Date   CHOLECYSTECTOMY, LAPAROSCOPIC     LAPAROSCOPIC CHOLECYSTECTOMY  1998   TOTAL ABDOMINAL HYSTERECTOMY  1991   TUBAL LIGATION       FAMILY HISTORY: Family History  Problem Relation Age of Onset   Hypertension Mother    Diabetes Mother    Heart failure Mother      SOCIAL HISTORY: Social History   Socioeconomic History  Marital status: Married    Spouse name: Not on file   Number of children: Not on file   Years of education: Not on file   Highest education level: Not on file  Occupational History   Not on file  Tobacco Use   Smoking status: Former    Current packs/day: 0.00    Types: Cigarettes    Quit date: 10/16/2014    Years since quitting: 8.9   Smokeless tobacco: Never  Substance and Sexual Activity   Alcohol use: Yes    Comment: 4 daily   Drug use: No   Sexual activity: Not Currently  Other Topics Concern   Not on file  Social History Narrative   ** Merged History  Encounter **       Lives home, with husband, Works at [employer].  Education: Chief Operating Officer.   Caffeine Rare Decaff.     Social Determinants of Health   Financial Resource Strain: Not on file  Food Insecurity: Not on file  Transportation Needs: Not on file  Physical Activity: Not on file  Stress: Not on file  Social Connections: Unknown (07/18/2022)   [facility]   Social Network    Social Network: Not on file  Intimate Partner Violence: Unknown (07/18/2022)   Received from [facility], [facility]   HITS    Physically Hurt: Not on file    Insult or Talk Down To: Not on file    Threaten Physical Harm: Not on file    Scream or Curse: Not on file      PHYSICAL EXAM  There were no vitals filed for this visit.   There is no height or weight on file to calculate BMI.   Generalized: Well developed, in no acute distress  Cardiology: normal rate and rhythm, no murmur auscultated  Respiratory: clear to auscultation bilaterally    Neurological examination  Mentation: Alert oriented to time, place, history taking. Follows all commands speech and language fluent Cranial nerve II-XII: Pupils were equal round reactive to light. Extraocular movements were full, visual field were full on confrontational test. Facial sensation and strength were normal. Head turning and shoulder shrug  were normal and symmetric. Motor: The motor testing reveals 5 over 5 strength of all 4 extremities. Good symmetric motor tone is noted throughout.  Sensory: Sensory testing is intact to soft touch on all 4 extremities. No evidence of extinction is noted.  Coordination: Cerebellar testing reveals good finger-nose-finger and heel-to-shin bilaterally.  Gait and station: Gait is normal.  Reflexes: Deep tendon reflexes are symmetric and normal bilaterally.     DIAGNOSTIC DATA (LABS, IMAGING, TESTING) - I reviewed patient records, labs, notes, testing and imaging myself  where available.  No results found for: "WBC", "HGB", "HCT", "MCV", "PLT" No results found for: "NA", "K", "CL", "CO2", "GLUCOSE", "BUN", "CREATININE", "CALCIUM", "PROT", "ALBUMIN", "AST", "ALT", "ALKPHOS", "BILITOT", "GFRNONAA", "GFRAA" No results found for: "CHOL", "HDL", "LDLCALC", "LDLDIRECT", "TRIG", "CHOLHDL" No results found for: "HGBA1C" No results found for: "VITAMINB12" No results found for: "TSH"      No data to display           ASSESSMENT AND PLAN  62 y.o. year old female  has a past medical history of Bunion, Chest pain, Headache, Obesity, Osteoarthritis, Paresthesia of both hands, Seizures (HCC) (04/2018), and Tobacco abuse. here with   No diagnosis found.  Bralyn is doing very well from a seizure standpoint.  We will continue levetiracetam 500 mg twice  daily. Healthy lifestyle habits encouraged.  Seizure precautions reviewed.  She was advised to avoid abrupt discontinuation of alcohol use but encouraged to drink in moderation.  She should not drive when drinking alcohol. Continue regular follow up with PCP. She will follow-up with me in 1 year, sooner if needed.  She verbalizes understanding and agreement with this plan.   No orders of the defined types were placed in this encounter.  No orders of the defined types were placed in this encounter.    Shawnie Dapper, MSN, FNP-C 09/26/2023, 1:32 PM  [facility] [address], [address] [place], [place] [address] [phone]

## 2023-09-27 ENCOUNTER — Ambulatory Visit: Payer: 59 | Admitting: Family Medicine

## 2023-09-27 ENCOUNTER — Encounter: Payer: Self-pay | Admitting: Family Medicine

## 2023-09-27 VITALS — BP 142/84 | HR 77 | Ht 65.0 in | Wt 218.0 lb

## 2023-09-27 DIAGNOSIS — G40909 Epilepsy, unspecified, not intractable, without status epilepticus: Secondary | ICD-10-CM | POA: Diagnosis not present

## 2023-09-27 DIAGNOSIS — F331 Major depressive disorder, recurrent, moderate: Secondary | ICD-10-CM | POA: Diagnosis not present

## 2023-09-27 MED ORDER — LEVETIRACETAM 500 MG PO TABS: 500.0000 mg | ORAL_TABLET | Freq: Two times a day (BID) | ORAL | 3 refills | Status: AC

## 2023-10-08 DIAGNOSIS — E781 Pure hyperglyceridemia: Secondary | ICD-10-CM | POA: Diagnosis not present

## 2023-10-08 DIAGNOSIS — Z0000 Encounter for general adult medical examination without abnormal findings: Secondary | ICD-10-CM | POA: Diagnosis not present

## 2024-01-11 DIAGNOSIS — Z860101 Personal history of adenomatous and serrated colon polyps: Secondary | ICD-10-CM | POA: Diagnosis not present

## 2024-01-11 DIAGNOSIS — Z09 Encounter for follow-up examination after completed treatment for conditions other than malignant neoplasm: Secondary | ICD-10-CM | POA: Diagnosis not present

## 2024-03-12 ENCOUNTER — Other Ambulatory Visit: Payer: Self-pay | Admitting: Internal Medicine

## 2024-03-12 DIAGNOSIS — Z1231 Encounter for screening mammogram for malignant neoplasm of breast: Secondary | ICD-10-CM

## 2024-03-25 ENCOUNTER — Ambulatory Visit
Admission: RE | Admit: 2024-03-25 | Discharge: 2024-03-25 | Disposition: A | Discharge status: Home / Self Care | Source: Ambulatory Visit | Attending: Internal Medicine

## 2024-03-25 DIAGNOSIS — Z1231 Encounter for screening mammogram for malignant neoplasm of breast: Secondary | ICD-10-CM

## 2024-04-03 DIAGNOSIS — Z01419 Encounter for gynecological examination (general) (routine) without abnormal findings: Secondary | ICD-10-CM | POA: Diagnosis not present

## 2024-04-03 DIAGNOSIS — Z7251 High risk heterosexual behavior: Secondary | ICD-10-CM | POA: Diagnosis not present

## 2024-04-28 DIAGNOSIS — F331 Major depressive disorder, recurrent, moderate: Secondary | ICD-10-CM | POA: Diagnosis not present

## 2024-05-28 DIAGNOSIS — F331 Major depressive disorder, recurrent, moderate: Secondary | ICD-10-CM | POA: Diagnosis not present

## 2024-06-12 DIAGNOSIS — F331 Major depressive disorder, recurrent, moderate: Secondary | ICD-10-CM | POA: Diagnosis not present

## 2024-07-03 DIAGNOSIS — F331 Major depressive disorder, recurrent, moderate: Secondary | ICD-10-CM | POA: Diagnosis not present

## 2024-07-24 DIAGNOSIS — F331 Major depressive disorder, recurrent, moderate: Secondary | ICD-10-CM | POA: Diagnosis not present

## 2024-08-28 DIAGNOSIS — F331 Major depressive disorder, recurrent, moderate: Secondary | ICD-10-CM | POA: Diagnosis not present

## 2024-09-15 DIAGNOSIS — F331 Major depressive disorder, recurrent, moderate: Secondary | ICD-10-CM | POA: Diagnosis not present

## 2024-09-29 ENCOUNTER — Ambulatory Visit: Payer: BC Managed Care – PPO | Admitting: Family Medicine

## 2024-10-06 DIAGNOSIS — F331 Major depressive disorder, recurrent, moderate: Secondary | ICD-10-CM | POA: Diagnosis not present

## 2024-12-02 ENCOUNTER — Ambulatory Visit: Admitting: Diagnostic Neuroimaging
